# Patient Record
Sex: Male | Born: 1960 | Race: Black or African American | Hispanic: No | Marital: Single | Smoking: Never smoker
Health system: Southern US, Community
[De-identification: ages and names within clinical notes are randomized; demographics above are authoritative.]

## PROBLEM LIST (undated history)

## (undated) DIAGNOSIS — R011 Cardiac murmur, unspecified: Secondary | ICD-10-CM

## (undated) DIAGNOSIS — J349 Unspecified disorder of nose and nasal sinuses: Secondary | ICD-10-CM

## (undated) DIAGNOSIS — B029 Zoster without complications: Secondary | ICD-10-CM

## (undated) DIAGNOSIS — I10 Essential (primary) hypertension: Secondary | ICD-10-CM

## (undated) DIAGNOSIS — F419 Anxiety disorder, unspecified: Secondary | ICD-10-CM

## (undated) DIAGNOSIS — T7840XA Allergy, unspecified, initial encounter: Secondary | ICD-10-CM

## (undated) DIAGNOSIS — Z9889 Other specified postprocedural states: Secondary | ICD-10-CM

## (undated) DIAGNOSIS — M1990 Unspecified osteoarthritis, unspecified site: Secondary | ICD-10-CM

## (undated) HISTORY — PX: JOINT REPLACEMENT: SHX530

## (undated) HISTORY — DX: Allergy, unspecified, initial encounter: T78.40XA

## (undated) HISTORY — DX: Cardiac murmur, unspecified: R01.1

## (undated) HISTORY — DX: Zoster without complications: B02.9

## (undated) HISTORY — PX: OTHER SURGICAL HISTORY: SHX169

## (undated) HISTORY — DX: Other specified postprocedural states: Z98.890

---
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
## 2001-06-24 ENCOUNTER — Emergency Department (HOSPITAL_COMMUNITY): Admission: EM | Admit: 2001-06-24 | Discharge: 2001-06-24 | Payer: Self-pay | Admitting: Emergency Medicine

## 2005-05-20 ENCOUNTER — Ambulatory Visit: Payer: Self-pay | Admitting: Family Medicine

## 2007-04-10 ENCOUNTER — Ambulatory Visit (HOSPITAL_COMMUNITY): Admission: RE | Admit: 2007-04-10 | Discharge: 2007-04-10 | Payer: Self-pay | Admitting: Gastroenterology

## 2009-12-22 ENCOUNTER — Encounter: Payer: Self-pay | Admitting: Family Medicine

## 2011-01-18 NOTE — Letter (Signed)
Summary: Historic Patient File  Historic Patient File   Imported By: Lind Guest 12/22/2009 09:19:21  _____________________________________________________________________  External Attachment:    Type:   Image     Comment:   External Document

## 2012-03-14 ENCOUNTER — Other Ambulatory Visit: Payer: Self-pay | Admitting: *Deleted

## 2012-03-14 MED ORDER — LISINOPRIL 10 MG PO TABS: 10.0000 mg | ORAL_TABLET | Freq: Every day | ORAL | Status: DC

## 2012-11-12 ENCOUNTER — Ambulatory Visit (INDEPENDENT_AMBULATORY_CARE_PROVIDER_SITE_OTHER): Payer: BC Managed Care – PPO | Admitting: Family Medicine

## 2012-11-12 ENCOUNTER — Encounter: Payer: Self-pay | Admitting: Family Medicine

## 2012-11-12 VITALS — BP 158/104 | HR 90 | Temp 98.5°F | Resp 16 | Ht 69.5 in | Wt 187.0 lb

## 2012-11-12 DIAGNOSIS — M1990 Unspecified osteoarthritis, unspecified site: Secondary | ICD-10-CM

## 2012-11-12 DIAGNOSIS — Z0000 Encounter for general adult medical examination without abnormal findings: Secondary | ICD-10-CM

## 2012-11-12 DIAGNOSIS — M25569 Pain in unspecified knee: Secondary | ICD-10-CM

## 2012-11-12 DIAGNOSIS — I10 Essential (primary) hypertension: Secondary | ICD-10-CM | POA: Insufficient documentation

## 2012-11-12 DIAGNOSIS — E871 Hypo-osmolality and hyponatremia: Secondary | ICD-10-CM

## 2012-11-12 MED ORDER — LISINOPRIL 10 MG PO TABS: 10.0000 mg | ORAL_TABLET | Freq: Every day | ORAL | Status: DC

## 2012-11-12 MED ORDER — NIFEDIPINE ER OSMOTIC RELEASE 90 MG PO TB24: 90.0000 mg | ORAL_TABLET | Freq: Every day | ORAL | Status: DC

## 2012-11-12 MED ORDER — HYDROCODONE-ACETAMINOPHEN 5-325 MG PO TABS: 1.0000 | ORAL_TABLET | Freq: Four times a day (QID) | ORAL | Status: DC | PRN

## 2012-11-12 NOTE — Progress Notes (Signed)
Subjective:    Patient ID: Damon Butler, male    DOB: [birth year], 51 y.o.   MRN: [id number]  HPI Damon Butler is a 51 y.o. male  Here for CPE.  Colonoscopy in 2008 - normal, repeat in 10 years.   Last td less than 10 years ago. Declined flu vaccine.  LDL 116 in 5/12.  Plan on fish oil, diet change and recheck in 6 months.  Last psa 0.45 - 04/19/11.  Fasting today.    Hx of HTN, acne.    HTN - out of meds for months. Last ov May 2012.  Ran out of blood pressure medicine 4-6 months ago. Prior on lisinopril 10mg  QD, outside bp's on meds 120-130/80-90. Slight drunk feeeling at times, adn slight swelling in R ankle on meds.  Hyponatremia - Na 132 on 04/19/11.  Plan on follow up in 2 weeks.  Has not been rechecked.   Knee pain - both knees - seen by [facility] - 4/25.  Hx of accident in past with hardware in R knee - R worse than L knee. bad arthritis now. pain medicine, or replacement were options discussed by ortho.  Favoring l knee - more sore now in addition to Right side.  Slow work - out of work.   Review of Systems Per phs - CMA note    Objective:   Physical Exam  Constitutional: He is oriented to person, place, and time. He appears well-developed and well-nourished.  HENT:  Head: Normocephalic and atraumatic.  Right Ear: External ear normal.  Left Ear: External ear normal.  Mouth/Throat: Oropharynx is clear and moist.  Eyes: Conjunctivae normal and EOM are normal. Pupils are equal, round, and reactive to light.  Neck: Normal range of motion. Neck supple. No thyromegaly present.  Cardiovascular: Normal rate, regular rhythm, normal heart sounds and intact distal pulses.   Pulmonary/Chest: Effort normal and breath sounds normal. No respiratory distress. He has no wheezes.  Abdominal: Soft. He exhibits no distension. There is no tenderness. Hernia confirmed negative in the right inguinal area and confirmed negative in the left inguinal area.  Genitourinary:  Prostate normal.  Musculoskeletal: He exhibits no edema.       Right knee: He exhibits deformity (notable bony exostosis/oa at joint line. ). He exhibits no effusion, no ecchymosis and no erythema.  Lymphadenopathy:    He has no cervical adenopathy.  Neurological: He is alert and oriented to person, place, and time. He has normal reflexes.  Skin: Skin is warm and dry.  Psychiatric: He has a normal mood and affect. His behavior is normal.          Assessment & Plan:  Damon Butler is a 51 y.o. male 1. HTN (hypertension)  lisinopril (PRINIVIL,ZESTRIL) 10 MG tablet, NIFEdipine (PROCARDIA XL/ADALAT-CC) 90 MG 24 hr tablet, Lipid panel  2. Annual physical exam  CBC with Differential, HIV antibody, PSA, RPR, GC/chlamydia probe amp, urine, Comprehensive metabolic panel, Lipid panel  3. Hyponatremia  HIV antibody, PSA, RPR  4. Knee pain  HYDROcodone-acetaminophen (NORCO/VICODIN) 5-325 MG per tablet, CBC with Differential  5. Osteoarthritis  HYDROcodone-acetaminophen (NORCO/VICODIN) 5-325 MG per tablet, CBC with Differential, Comprehensive metabolic panel     CPE - labs as above.   HTN - uncontrolled with med nonadherence. Possible overcorrected prior and hyponatremia - will restart just the lisinopril and nifedipine.  Follow up in next few weeks. Orthostatic precautions.  R greater than L knee pain - OA,.  Deciding on possible pain mgt  eval as hesitant to have surgery at this point.  Discussed I can provide short term hydrocodone if needed, but would consider pain mgt eval.  Will discuss further at next office visit.  Hx of acne - will refill tetracycline.    Patient Instructions  Your should receive a call or letter about your lab results within the next week to 10 days.  Restart the two blood pressure medicines - lisinopril and nifedipine for now.  Keep a record of your blood pressures outside of the office and bring them to the next office visit. Recheck in the next 2-3 weeks.    Return to the clinic or go to the nearest emergency room if any of your symptoms worsen or new symptoms occur. Keeping you healthy  Get these tests  Blood pressure- Have your blood pressure checked once a year by your healthcare provider.  Normal blood pressure is 120/80  Weight- Have your body mass index (BMI) calculated to screen for obesity.  BMI is a measure of body fat based on height and weight. You can also calculate your own BMI at [email or web address].  Cholesterol- Have your cholesterol checked every year.  Diabetes- Have your blood sugar checked regularly if you have high blood pressure, high cholesterol, have a family history of diabetes or if you are overweight.  Screening for Colon Cancer- Colonoscopy starting at age 71.  Screening may begin sooner depending on your family history and other health conditions. Follow up colonoscopy as directed by your Gastroenterologist.  Screening for Prostate Cancer- Both blood work (PSA) and a rectal exam help screen for Prostate Cancer.  Screening begins at age 63 with African-American men and at age 41 with Caucasian men.  Screening may begin sooner depending on your family history.  Take these medicines  Aspirin- One aspirin daily can help prevent Heart disease and Stroke.  Flu shot- Every fall.  Tetanus- Every 10 years.  Zostavax- Once after the age of 12 to prevent Shingles.  Pneumonia shot- Once after the age of 63; if you are younger than 49, ask your healthcare provider if you need a Pneumonia shot.  Take these steps  Don't smoke- If you do smoke, talk to your doctor about quitting.  For tips on how to quit, go to www.smokefree.gov or call 1-800-QUIT-NOW.  Be physically active- Exercise 5 days a week for at least 30 minutes.  If you are not already physically active start slow and gradually work up to 30 minutes of moderate physical activity.  Examples of moderate activity include walking briskly, mowing the yard,  dancing, swimming, bicycling, etc.  Eat a healthy diet- Eat a variety of healthy food such as fruits, vegetables, low fat milk, low fat cheese, yogurt, lean meant, poultry, fish, beans, tofu, etc. For more information go to www.thenutritionsource.org  Drink alcohol in moderation- Limit alcohol intake to less than two drinks a day. Never drink and drive.  Dentist- Brush and floss twice daily; visit your dentist twice a year.  Depression- Your emotional health is as important as your physical health. If you're feeling down, or losing interest in things you would normally enjoy please talk to your healthcare provider.  Eye exam- Visit your eye doctor every year.  Safe sex- If you may be exposed to a sexually transmitted infection, use a condom.  Seat belts- Seat belts can save your life; always wear one.  Smoke/Carbon Monoxide detectors- These detectors need to be installed on the appropriate level of your home.  Replace batteries  at least once a year.  Skin cancer- When out in the sun, cover up and use sunscreen 15 SPF or higher.  Violence- If anyone is threatening you, please tell your healthcare provider.  Living Will/ Health care power of attorney- Speak with your healthcare provider and family.

## 2012-11-12 NOTE — Patient Instructions (Signed)
Your should receive a call or letter about your lab results within the next week to 10 days.  Restart the two blood pressure medicines - lisinopril and nifedipine for now.  Keep a record of your blood pressures outside of the office and bring them to the next office visit. Recheck in the next 2-3 weeks.  Return to the clinic or go to the nearest emergency room if any of your symptoms worsen or new symptoms occur. Keeping you healthy  Get these tests  Blood pressure- Have your blood pressure checked once a year by your healthcare provider.  Normal blood pressure is 120/80  Weight- Have your body mass index (BMI) calculated to screen for obesity.  BMI is a measure of body fat based on height and weight. You can also calculate your own BMI at [email or web address].  Cholesterol- Have your cholesterol checked every year.  Diabetes- Have your blood sugar checked regularly if you have high blood pressure, high cholesterol, have a family history of diabetes or if you are overweight.  Screening for Colon Cancer- Colonoscopy starting at age 71.  Screening may begin sooner depending on your family history and other health conditions. Follow up colonoscopy as directed by your Gastroenterologist.  Screening for Prostate Cancer- Both blood work (PSA) and a rectal exam help screen for Prostate Cancer.  Screening begins at age 31 with African-American men and at age 20 with Caucasian men.  Screening may begin sooner depending on your family history.  Take these medicines  Aspirin- One aspirin daily can help prevent Heart disease and Stroke.  Flu shot- Every fall.  Tetanus- Every 10 years.  Zostavax- Once after the age of 37 to prevent Shingles.  Pneumonia shot- Once after the age of 81; if you are younger than 59, ask your healthcare provider if you need a Pneumonia shot.  Take these steps  Don't smoke- If you do smoke, talk to your doctor about quitting.  For tips on how to quit, go to  www.smokefree.gov or call 1-800-QUIT-NOW.  Be physically active- Exercise 5 days a week for at least 30 minutes.  If you are not already physically active start slow and gradually work up to 30 minutes of moderate physical activity.  Examples of moderate activity include walking briskly, mowing the yard, dancing, swimming, bicycling, etc.  Eat a healthy diet- Eat a variety of healthy food such as fruits, vegetables, low fat milk, low fat cheese, yogurt, lean meant, poultry, fish, beans, tofu, etc. For more information go to www.thenutritionsource.org  Drink alcohol in moderation- Limit alcohol intake to less than two drinks a day. Never drink and drive.  Dentist- Brush and floss twice daily; visit your dentist twice a year.  Depression- Your emotional health is as important as your physical health. If you're feeling down, or losing interest in things you would normally enjoy please talk to your healthcare provider.  Eye exam- Visit your eye doctor every year.  Safe sex- If you may be exposed to a sexually transmitted infection, use a condom.  Seat belts- Seat belts can save your life; always wear one.  Smoke/Carbon Monoxide detectors- These detectors need to be installed on the appropriate level of your home.  Replace batteries at least once a year.  Skin cancer- When out in the sun, cover up and use sunscreen 15 SPF or higher.  Violence- If anyone is threatening you, please tell your healthcare provider.  Living Will/ Health care power of attorney- Speak with your healthcare provider and family.

## 2012-11-13 LAB — CBC WITH DIFFERENTIAL/PLATELET
Basophils Absolute: 0 10*3/uL (ref 0.0–0.1)
Basophils Relative: 0 % (ref 0–1)
Eosinophils Relative: 1 % (ref 0–5)
HCT: 47.4 % (ref 39.0–52.0)
MCHC: 34.8 g/dL (ref 30.0–36.0)
Monocytes Absolute: 0.4 10*3/uL (ref 0.1–1.0)
Neutro Abs: 3.9 10*3/uL (ref 1.7–7.7)
RDW: 13.6 % (ref 11.5–15.5)

## 2012-11-13 LAB — COMPREHENSIVE METABOLIC PANEL
Albumin: 4.7 g/dL (ref 3.5–5.2)
CO2: 19 mEq/L (ref 19–32)
Calcium: 9.6 mg/dL (ref 8.4–10.5)
Glucose, Bld: 114 mg/dL — ABNORMAL HIGH (ref 70–99)
Potassium: 4 mEq/L (ref 3.5–5.3)
Sodium: 139 mEq/L (ref 135–145)
Total Protein: 7.7 g/dL (ref 6.0–8.3)

## 2012-11-13 LAB — PSA: PSA: 0.47 ng/mL (ref <=4.00)

## 2012-11-13 LAB — LIPID PANEL
Cholesterol: 192 mg/dL (ref 0–200)
Triglycerides: 62 mg/dL (ref <150)

## 2012-11-26 ENCOUNTER — Ambulatory Visit (INDEPENDENT_AMBULATORY_CARE_PROVIDER_SITE_OTHER): Payer: BC Managed Care – PPO | Admitting: Family Medicine

## 2012-11-26 ENCOUNTER — Encounter: Payer: Self-pay | Admitting: Family Medicine

## 2012-11-26 VITALS — BP 120/90 | HR 108 | Temp 98.1°F | Resp 16 | Ht 69.5 in | Wt 186.6 lb

## 2012-11-26 DIAGNOSIS — I10 Essential (primary) hypertension: Secondary | ICD-10-CM

## 2012-11-26 DIAGNOSIS — E785 Hyperlipidemia, unspecified: Secondary | ICD-10-CM

## 2012-11-26 DIAGNOSIS — R7989 Other specified abnormal findings of blood chemistry: Secondary | ICD-10-CM

## 2012-11-26 DIAGNOSIS — R739 Hyperglycemia, unspecified: Secondary | ICD-10-CM

## 2012-11-26 DIAGNOSIS — M25569 Pain in unspecified knee: Secondary | ICD-10-CM

## 2012-11-26 DIAGNOSIS — M1990 Unspecified osteoarthritis, unspecified site: Secondary | ICD-10-CM

## 2012-11-26 LAB — GLUCOSE, POCT (MANUAL RESULT ENTRY): POC Glucose: 115 mg/dl — AB (ref 70–99)

## 2012-11-26 MED ORDER — HYDROCODONE-ACETAMINOPHEN 5-325 MG PO TABS: 1.0000 | ORAL_TABLET | Freq: Four times a day (QID) | ORAL | Status: DC | PRN

## 2012-11-26 NOTE — Progress Notes (Signed)
Subjective:    Patient ID: Damon Butler, male    DOB: 1961-06-10, 51 y.o.   MRN: [id number]  HPI Damon Butler is a 51 y.o. male  See last ov.  Had been off meds for months, but possibly overtreated pressure by hx.  Also noted to have hyponatremia at prior ov without follow up. uncontrolled last ov with med nonadherence. Restarted just the lisinopril and nifedipine.  Here for recheck. Lipids borderline with LDL 122, glucose elevated at 114 on last visit. Na WNL. Here for follow up today.   Feeling pretty good. Recent BP's on meds 116/84, 109/83,  No lightheadedness or dizziness on current dose of meds.    Lipids - taking fish oil.  Taking baby asa qd recently.  Has been trying to lose weight, and eating better since that office visit.   Hyperglycemia - Fasting again today, no hx of DM. Weight had been increased last ov.   R knee pain - see last ov . Knee pain - both knees - seen by [facility] - 4/25.  Hx of accident in past with hardware in R knee - R worse than L knee. bad arthritis now - bone on bone.  pain medicine, shots, or replacement were options discussed by ortho.  Favoring l knee - more sore now in addition to Right side. Taking Norco 1-2 times per day. #20 rx last ov. Ran out last week.     Results for orders placed in visit on 11/12/12  CBC WITH DIFFERENTIAL      Component Value Range   WBC 5.3  4.0 - 10.5 K/uL   RBC 5.38  4.22 - 5.81 MIL/uL   Hemoglobin 16.5  13.0 - 17.0 g/dL   HCT 95.2  84.1 - 32.4 %   MCV 88.1  78.0 - 100.0 fL   MCH 30.7  26.0 - 34.0 pg   MCHC 34.8  30.0 - 36.0 g/dL   RDW 40.1  02.7 - 25.3 %   Platelets 355  150 - 400 K/uL   Neutrophils Relative 73  43 - 77 %   Neutro Abs 3.9  1.7 - 7.7 K/uL   Lymphocytes Relative 18  12 - 46 %   Lymphs Abs 0.9  0.7 - 4.0 K/uL   Monocytes Relative 8  3 - 12 %   Monocytes Absolute 0.4  0.1 - 1.0 K/uL   Eosinophils Relative 1  0 - 5 %   Eosinophils Absolute 0.1  0.0 - 0.7 K/uL   Basophils Relative  0  0 - 1 %   Basophils Absolute 0.0  0.0 - 0.1 K/uL   Smear Review Criteria for review not met    HIV ANTIBODY (ROUTINE TESTING)      Component Value Range   HIV NON REACTIVE  NON REACTIVE  PSA      Component Value Range   PSA 0.47  <=4.00 ng/mL  RPR      Component Value Range   RPR NON REAC  NON REAC  GC/CHLAMYDIA PROBE AMP, URINE      Component Value Range   Chlamydia, Swab/Urine, PCR NEGATIVE  NEGATIVE   GC Probe Amp, Urine NEGATIVE  NEGATIVE  COMPREHENSIVE METABOLIC PANEL      Component Value Range   Sodium 139  135 - 145 mEq/L   Potassium 4.0  3.5 - 5.3 mEq/L   Chloride 101  96 - 112 mEq/L   CO2 19  19 - 32 mEq/L   Glucose, Bld  114 (*) 70 - 99 mg/dL   BUN 14  6 - 23 mg/dL   Creat 0.45  4.09 - 8.11 mg/dL   Total Bilirubin 0.7  0.3 - 1.2 mg/dL   Alkaline Phosphatase 62  39 - 117 U/L   AST 23  0 - 37 U/L   ALT 23  0 - 53 U/L   Total Protein 7.7  6.0 - 8.3 g/dL   Albumin 4.7  3.5 - 5.2 g/dL   Calcium 9.6  8.4 - 91.4 mg/dL  LIPID PANEL      Component Value Range   Cholesterol 192  0 - 200 mg/dL   Triglycerides 62  <782 mg/dL   HDL 58  >95 mg/dL   Total CHOL/HDL Ratio 3.3     VLDL 12  0 - 40 mg/dL   LDL Cholesterol 621 (*) 0 - 99 mg/dL    Review of Systems  Constitutional: Negative for fatigue and unexpected weight change.  Eyes: Negative for visual disturbance.  Respiratory: Negative for cough, chest tightness and shortness of breath.   Cardiovascular: Negative for chest pain, palpitations and leg swelling.  Gastrointestinal: Negative for abdominal pain and blood in stool.  Neurological: Negative for dizziness, light-headedness and headaches.       Objective:   Physical Exam  Constitutional: He is oriented to person, place, and time. He appears well-developed and well-nourished.  HENT:  Head: Normocephalic and atraumatic.  Eyes: EOM are normal. Pupils are equal, round, and reactive to light.  Neck: No JVD present. Carotid bruit is not present.   Cardiovascular: Normal rate, regular rhythm and normal heart sounds.   No murmur heard. Pulmonary/Chest: Effort normal and breath sounds normal. He has no rales.  Musculoskeletal: He exhibits no edema.       Right knee: He exhibits decreased range of motion (decreased extension. medial ttp, with bony prominence bilaterrally. ). tenderness found.  Neurological: He is alert and oriented to person, place, and time.  Skin: Skin is warm and dry.  Psychiatric: He has a normal mood and affect.   Results for orders placed in visit on 11/26/12  GLUCOSE, POCT (MANUAL RESULT ENTRY)      Component Value Range   POC Glucose 115 (*) 70 - 99 mg/dl  POCT GLYCOSYLATED HEMOGLOBIN (HGB A1C)      Component Value Range   Hemoglobin A1C 5.6         Assessment & Plan:  Damon Butler is a 51 y.o. male 1. HTN (hypertension)    2. Hyperglycemia  POCT glucose (manual entry), POCT glycosylated hemoglobin (Hb A1C)  3. Hyperlipidemia    4. Knee pain  HYDROcodone-acetaminophen (NORCO/VICODIN) 5-325 MG per tablet  5. Osteoarthritis  HYDROcodone-acetaminophen (NORCO/VICODIN) 5-325 MG per tablet   HTN - better control.  Continue same regimen and recheck in 3 months.   Hyperlipidemia - working on diet and exercise - recheck fasting labs in 3 months.   Hyperglycemia - prediabetes discussed.  Working on diet and exercise as above - recheck in 3 months.   R knee pain with severe OA.  lortab rx - # 60, but he will be deciding on pain mgt or ortho to follow. Asked about disability - advised he look at [facility] website and discuss with his orthopaedist.   Patient Instructions  Work on diet and exercise - recheck blood sugar and cholesterol in next 3 months.  Follow up with your orthopaedic doctor or let me know if you need a referral to a  pain management specialist for your knee.  Return to the clinic or go to the nearest emergency room if any of your symptoms worsen or new symptoms occur.

## 2012-11-26 NOTE — Patient Instructions (Addendum)
Work on diet and exercise - recheck blood sugar and cholesterol in next 3 months.  Follow up with your orthopaedic doctor or let me know if you need a referral to a pain management specialist for your knee.  Return to the clinic or go to the nearest emergency room if any of your symptoms worsen or new symptoms occur.

## 2013-02-25 ENCOUNTER — Telehealth: Payer: Self-pay | Admitting: Radiology

## 2013-02-25 ENCOUNTER — Encounter: Payer: Self-pay | Admitting: Family Medicine

## 2013-02-25 ENCOUNTER — Ambulatory Visit (INDEPENDENT_AMBULATORY_CARE_PROVIDER_SITE_OTHER): Payer: BC Managed Care – PPO | Admitting: Family Medicine

## 2013-02-25 VITALS — BP 125/81 | HR 95 | Temp 98.1°F | Resp 16 | Ht 69.5 in | Wt 186.0 lb

## 2013-02-25 DIAGNOSIS — I10 Essential (primary) hypertension: Secondary | ICD-10-CM

## 2013-02-25 DIAGNOSIS — R739 Hyperglycemia, unspecified: Secondary | ICD-10-CM

## 2013-02-25 DIAGNOSIS — E785 Hyperlipidemia, unspecified: Secondary | ICD-10-CM

## 2013-02-25 DIAGNOSIS — L709 Acne, unspecified: Secondary | ICD-10-CM

## 2013-02-25 DIAGNOSIS — Z23 Encounter for immunization: Secondary | ICD-10-CM

## 2013-02-25 DIAGNOSIS — L708 Other acne: Secondary | ICD-10-CM

## 2013-02-25 DIAGNOSIS — M25569 Pain in unspecified knee: Secondary | ICD-10-CM

## 2013-02-25 MED ORDER — LISINOPRIL 10 MG PO TABS: 10.0000 mg | ORAL_TABLET | Freq: Every day | ORAL | Status: DC

## 2013-02-25 MED ORDER — NIFEDIPINE ER OSMOTIC RELEASE 90 MG PO TB24: 90.0000 mg | ORAL_TABLET | Freq: Every day | ORAL | Status: DC

## 2013-02-25 MED ORDER — TETRACYCLINE HCL 500 MG PO CAPS: 500.0000 mg | ORAL_CAPSULE | Freq: Every day | ORAL | Status: DC | PRN

## 2013-02-25 MED ORDER — DOXYCYCLINE HYCLATE 50 MG PO CAPS: 50.0000 mg | ORAL_CAPSULE | Freq: Two times a day (BID) | ORAL | Status: DC

## 2013-02-25 NOTE — Progress Notes (Signed)
Subjective:    Patient ID: Damon Butler, male    DOB: 1961/09/11, 52 y.o.   MRN: [id number]  HPI Damon Butler is a 52 y.o. male HTN - see prior ov's - last ov 11/26/12. Had been off meds, but better control at last ov after restarting meds. Continued same regimen. Outside numbers - 120/80's.   Hyperlipidemia - working on diet and exercise -planning on recheck labs today. Weight 186 last ov and today. Trying to change diet - some fast food, especially with recent power loss with weather.  exercise limited by knee pain. LDL 122 in November 2013. Taking fish oil otc.   Hyperglycemia - prediabetes discussed last ov. Aic 5.6 on 11/26/12.  Plan on working on diet and exercise as above.  Drinks sweet tea- 2 per day. Plans to cut back on this.   R knee pain with severe OA.  lortab rx - # 60, given last ov, but he was to be deciding on pain mgt or ortho to follow. Has been evaluated by ortho. Planning on seeing other ortho for second opinion - has not scheduled yet.  Still has some pills. Only taking lortab as needed - every few weeks.    Acne - face and back - usually controlled with 1 week of tetracycline every few months. Needs refill.    Fasting today.  Last food last night.   Last td unknown - about 10 years.   Review of Systems  Constitutional: Negative for fatigue and unexpected weight change.  Eyes: Negative for visual disturbance.  Respiratory: Negative for cough, chest tightness and shortness of breath.   Cardiovascular: Negative for chest pain, palpitations and leg swelling.  Gastrointestinal: Negative for abdominal pain and blood in stool.  Skin:       Acne - face, back intermittently.   Neurological: Negative for dizziness, light-headedness and headaches.       Objective:   Physical Exam  Vitals reviewed. Constitutional: He is oriented to person, place, and time. He appears well-developed and well-nourished.  HENT:  Head: Normocephalic and atraumatic.  Eyes:  EOM are normal. Pupils are equal, round, and reactive to light.  Neck: No JVD present. Carotid bruit is not present.  Cardiovascular: Normal rate, regular rhythm and normal heart sounds.   No murmur heard. Pulmonary/Chest: Effort normal and breath sounds normal. He has no rales.  Musculoskeletal: He exhibits no edema.  Neurological: He is alert and oriented to person, place, and time.  Skin: Skin is warm and dry. Rash (few scattered comedones and scarring - back, face. ) noted.  Psychiatric: He has a normal mood and affect. His behavior is normal.          Assessment & Plan:  [name] is a 52 y.o. male HTN (hypertension) - Plan: NIFEdipine (PROCARDIA XL/ADALAT-CC) 90 MG 24 hr tablet, lisinopril (PRINIVIL,ZESTRIL) 10 MG tablet, Comprehensive metabolic panel, Lipid panel.   Well controlled at present - continue same meds.   Other and unspecified hyperlipidemia - Plan: Comprehensive metabolic panel, Lipid panel.  Continue fish oil at present.   Hyperglycemia - Plan: Comprehensive metabolic panel for repeat.  Discussed diet changes including less sodas/sweet tea - transition to unsweet or water is best.   Acne - Plan: DISCONTINUED: tetracycline (ACHROMYCIN,SUMYCIN) 500 MG capsule. Uses intermittently for flairs for 1 week.  Controlled with this regimen.    Addendum: out of tetracycline at pharmacy - substituted doxycycline.  See phone notes.   Knee pain, unspecified laterality - chronic  with OA. Planning on other ortho eval.  Rare use of hydrocodone and has some left.  Pool based exercise discussed.   Need for prophylactic vaccination with combined diphtheria-tetanus-pertussis (DTP) vaccine - Plan: Tdap vaccine greater than or equal to 7yo IM  Patient Instructions  Your should receive a call or letter about your lab results within the next week to 10 days.  Let me know if a referral is needed for the other orhtopaedic doctor, or if refill of pain med needed.    Work on  avoiding sweet tea, fast food, and portion control as discussed.  Pool based exercise can be helpful in knee arthritis for exercise.  Return to the clinic or go to the nearest emergency room if any of your symptoms worsen or new symptoms occur.     Meds ordered this encounter  Medications  . DISCONTD: tetracycline (ACHROMYCIN,SUMYCIN) 500 MG capsule    Sig: Take 500 mg by mouth daily as needed (for Acne).  . DISCONTD: tetracycline (ACHROMYCIN,SUMYCIN) 500 MG capsule    Sig: Take 1 capsule (500 mg total) by mouth daily as needed (for Acne).    Dispense:  30 capsule    Refill:  1  . NIFEdipine (PROCARDIA XL/ADALAT-CC) 90 MG 24 hr tablet    Sig: Take 1 tablet (90 mg total) by mouth daily.    Dispense:  90 tablet    Refill:  1  . lisinopril (PRINIVIL,ZESTRIL) 10 MG tablet    Sig: Take 1 tablet (10 mg total) by mouth daily.    Dispense:  90 tablet    Refill:  1

## 2013-02-25 NOTE — Patient Instructions (Signed)
Your should receive a call or letter about your lab results within the next week to 10 days.  Let me know if a referral is needed for the other orhtopaedic doctor, or if refill of pain med needed.    Work on avoiding sweet tea, fast food, and portion control as discussed.  Pool based exercise can be helpful in knee arthritis for exercise.  Return to the clinic or go to the nearest emergency room if any of your symptoms worsen or new symptoms occur.

## 2013-02-25 NOTE — Telephone Encounter (Signed)
Tetracycline discontinued, Dr Neva Seat advised Doxy 50 bid/ called pharmacy to advise.

## 2013-02-26 LAB — LIPID PANEL
Cholesterol: 180 mg/dL (ref 0–200)
HDL: 51 mg/dL
LDL Cholesterol: 118 mg/dL — ABNORMAL HIGH (ref 0–99)
Total CHOL/HDL Ratio: 3.5 ratio
Triglycerides: 57 mg/dL
VLDL: 11 mg/dL (ref 0–40)

## 2013-02-26 LAB — COMPREHENSIVE METABOLIC PANEL
AST: 17 U/L (ref 0–37)
Albumin: 4.7 g/dL (ref 3.5–5.2)
Alkaline Phosphatase: 52 U/L (ref 39–117)
BUN: 17 mg/dL (ref 6–23)
Creat: 0.97 mg/dL (ref 0.50–1.35)
Glucose, Bld: 100 mg/dL — ABNORMAL HIGH (ref 70–99)
Potassium: 4.2 mEq/L (ref 3.5–5.3)
Total Bilirubin: 0.5 mg/dL (ref 0.3–1.2)

## 2013-05-17 ENCOUNTER — Other Ambulatory Visit: Payer: Self-pay | Admitting: Family Medicine

## 2013-08-12 ENCOUNTER — Encounter: Payer: Self-pay | Admitting: Family Medicine

## 2013-08-12 ENCOUNTER — Ambulatory Visit (INDEPENDENT_AMBULATORY_CARE_PROVIDER_SITE_OTHER): Payer: BC Managed Care – PPO | Admitting: Family Medicine

## 2013-08-12 VITALS — BP 122/84 | HR 104 | Temp 99.1°F | Resp 16 | Ht 70.0 in | Wt 188.0 lb

## 2013-08-12 DIAGNOSIS — IMO0002 Reserved for concepts with insufficient information to code with codable children: Secondary | ICD-10-CM

## 2013-08-12 DIAGNOSIS — L708 Other acne: Secondary | ICD-10-CM

## 2013-08-12 DIAGNOSIS — I10 Essential (primary) hypertension: Secondary | ICD-10-CM

## 2013-08-12 DIAGNOSIS — M1710 Unilateral primary osteoarthritis, unspecified knee: Secondary | ICD-10-CM

## 2013-08-12 DIAGNOSIS — M1711 Unilateral primary osteoarthritis, right knee: Secondary | ICD-10-CM

## 2013-08-12 DIAGNOSIS — L709 Acne, unspecified: Secondary | ICD-10-CM

## 2013-08-12 DIAGNOSIS — E785 Hyperlipidemia, unspecified: Secondary | ICD-10-CM

## 2013-08-12 LAB — LIPID PANEL
Cholesterol: 173 mg/dL (ref 0–200)
Total CHOL/HDL Ratio: 3.3 Ratio
Triglycerides: 100 mg/dL (ref <150)
VLDL: 20 mg/dL (ref 0–40)

## 2013-08-12 LAB — COMPREHENSIVE METABOLIC PANEL
BUN: 13 mg/dL (ref 6–23)
CO2: 24 mEq/L (ref 19–32)
Calcium: 9.6 mg/dL (ref 8.4–10.5)
Chloride: 99 mEq/L (ref 96–112)
Creat: 0.94 mg/dL (ref 0.50–1.35)
Glucose, Bld: 99 mg/dL (ref 70–99)
Total Bilirubin: 0.5 mg/dL (ref 0.3–1.2)

## 2013-08-12 MED ORDER — NIFEDIPINE ER OSMOTIC RELEASE 90 MG PO TB24: 90.0000 mg | ORAL_TABLET | Freq: Every day | ORAL | Status: DC

## 2013-08-12 MED ORDER — LISINOPRIL 10 MG PO TABS: 10.0000 mg | ORAL_TABLET | Freq: Every day | ORAL | Status: DC

## 2013-08-12 NOTE — Progress Notes (Signed)
Subjective:    Patient ID: Damon Butler, male    DOB: 1961/10/01, 52 y.o.   MRN: [id number]  HPI Damon Butler is a 52 y.o. male  Here for follow up. Last ov 02/25/13. Fasting today.   HTN - outside BP's - 120's/80-90.  Rare cramps - toes.  Loopy about a month ago  - not dizzy, not lightheaded, no chest pain. Just felt different for few hours, then back to normal.   Hyperlipidemia - working on diet and exercise prior, labs as below. Weight 186 last 2 ov's.   Exercise limited by knee pain. Taking fish oil otc. Walking. Weight 188. Has mead some diet changes. More fruit, but still eats late.   Hyperglycemia - prediabetes discussed prior, with HGB Aic 5.6 on 11/26/12.  Planned on working on diet and exercise.    R knee pain with severe OA.  lortab rx - # 60, given, and as of last ov -only taking lortab as needed - every few weeks. Has been seen by ortho and was getting second opinion. Would like to see Dr. Lequita Halt to discuss.   Acne - face and back - usually controlled with 1 week of tetracycline every few month in past.  Had to change to doxycycline 50mg  bid last ov due to backorder of tetracycline.  Only taking if needed- none recently. No refills needed on this.    Results for orders placed in visit on 02/25/13  COMPREHENSIVE METABOLIC PANEL      Result Value Range   Sodium 135  135 - 145 mEq/L   Potassium 4.2  3.5 - 5.3 mEq/L   Chloride 102  96 - 112 mEq/L   CO2 24  19 - 32 mEq/L   Glucose, Bld 100 (*) 70 - 99 mg/dL   BUN 17  6 - 23 mg/dL   Creat 4.09  8.11 - 9.14 mg/dL   Total Bilirubin 0.5  0.3 - 1.2 mg/dL   Alkaline Phosphatase 52  39 - 117 U/L   AST 17  0 - 37 U/L   ALT 17  0 - 53 U/L   Total Protein 7.2  6.0 - 8.3 g/dL   Albumin 4.7  3.5 - 5.2 g/dL   Calcium 9.4  8.4 - 78.2 mg/dL  LIPID PANEL      Result Value Range   Cholesterol 180  0 - 200 mg/dL   Triglycerides 57  <956 mg/dL   HDL 51  >21 mg/dL   Total CHOL/HDL Ratio 3.5     VLDL 11  0 - 40 mg/dL   LDL Cholesterol 308 (*) 0 - 99 mg/dL   Patient Active Problem List   Diagnosis Date Noted  . Other and unspecified hyperlipidemia 08/12/2013  . Acne 08/12/2013  . HTN (hypertension) 11/12/2012    Past Medical History  Diagnosis Date  . Allergy   . Heart murmur    No past surgical history on file.  No Known Allergies Prior to Admission medications   Medication Sig Start Date End Date Taking? Authorizing Provider  aspirin 81 MG tablet Take 81 mg by mouth daily.    Historical Provider, MD  doxycycline (VIBRAMYCIN) 50 MG capsule Take 1 capsule (50 mg total) by mouth 2 (two) times daily. 02/25/13   Shade Flood, MD  fish oil-omega-3 fatty acids 1000 MG capsule Take 2 g by mouth daily.    Historical Provider, MD  HYDROcodone-acetaminophen (NORCO/VICODIN) 5-325 MG per tablet Take 1 tablet by mouth every 6 (  six) hours as needed for pain. 11/26/12   Shade Flood, MD  lisinopril (PRINIVIL,ZESTRIL) 10 MG tablet Take 1 tablet (10 mg total) by mouth daily. 02/25/13 02/25/14  Shade Flood, MD  NIFEdipine (PROCARDIA XL/ADALAT-CC) 90 MG 24 hr tablet Take 1 tablet (90 mg total) by mouth daily. 02/25/13   Shade Flood, MD       Review of Systems  Constitutional: Negative for fatigue and unexpected weight change.  Eyes: Negative for visual disturbance.  Respiratory: Negative for cough, chest tightness and shortness of breath.   Cardiovascular: Negative for chest pain, palpitations and leg swelling.  Gastrointestinal: Negative for abdominal pain and blood in stool.  Musculoskeletal: Positive for myalgias (rare cramping in toes. ).  Neurological: Negative for dizziness (rare "loopiness" about a month ago, not now. no faocal weakness. ), light-headedness and headaches.       Objective:   Physical Exam  Vitals reviewed. Constitutional: He is oriented to person, place, and time. He appears well-developed and well-nourished.  HENT:  Head: Normocephalic and atraumatic.  Eyes: EOM are  normal. Pupils are equal, round, and reactive to light.  Neck: No JVD present. Carotid bruit is not present.  Cardiovascular: Normal rate, regular rhythm and normal heart sounds.   No murmur heard. Pulmonary/Chest: Effort normal and breath sounds normal. He has no rales.  Musculoskeletal: He exhibits no edema.  Neurological: He is alert and oriented to person, place, and time. He displays no atrophy and no tremor. No cranial nerve deficit or sensory deficit. He exhibits normal muscle tone. He displays a negative Romberg sign. He displays no seizure activity. Coordination and gait normal.  Nonfocal, no pronator drift, no weakness appreciated.   Skin: Skin is warm and dry.  Psychiatric: He has a normal mood and affect. His behavior is normal.        Assessment & Plan:   Damon Butler is a 52 y.o. male Other and unspecified hyperlipidemia - Plan: Comprehensive metabolic panel, Lipid panel pending. Cont fish oil, diet, exercise.   Acne - stable. Has doxycycline if needed for flair.   HTN (hypertension) - Plan: NIFEdipine (PROCARDIA XL/ADALAT-CC) 90 MG 24 hr tablet, lisinopril (PRINIVIL,ZESTRIL) 10 MG tablet refilled.  Stable at present.   Right knee DJD - Plan: Ambulatory referral to Orthopedic Surgery - referred to Dr. Lequita Halt for consideration of TKR. Cont vicodin if needed prn. Water based exercise may be easier.   Episode of "loopiness", now resolved.  No focal neuro findings, no residual sx's, but if recurs - go to ER or be seen right away.  Does work in the heat at times - drink plenty of fluids. Continue  ASA 81mg  qd, and er/911 precautions discussed.   Meds ordered this encounter  Medications  . NIFEdipine (PROCARDIA XL/ADALAT-CC) 90 MG 24 hr tablet    Sig: Take 1 tablet (90 mg total) by mouth daily.    Dispense:  90 tablet    Refill:  1  . lisinopril (PRINIVIL,ZESTRIL) 10 MG tablet    Sig: Take 1 tablet (10 mg total) by mouth daily.    Dispense:  90 tablet    Refill:   1   Patient Instructions  Return to the clinic or go to the nearest emergency room if any of your symptoms worsen or new symptoms occur. You should receive a call or letter about your lab results within the next week to 10 days.  We will refer you to Dr. Lequita Halt for your knee.  If  any further "loopy" symptoms - be seen right away - emergency room or call 911 if necessary.  Recheck in 6 months.

## 2013-08-12 NOTE — Patient Instructions (Signed)
Return to the clinic or go to the nearest emergency room if any of your symptoms worsen or new symptoms occur. You should receive a call or letter about your lab results within the next week to 10 days.  We will refer you to Dr. Lequita Halt for your knee.  If any further "loopy" symptoms - be seen right away - emergency room or call 911 if necessary.  Recheck in 6 months.

## 2013-08-18 ENCOUNTER — Other Ambulatory Visit: Payer: Self-pay | Admitting: Family Medicine

## 2013-08-18 DIAGNOSIS — E871 Hypo-osmolality and hyponatremia: Secondary | ICD-10-CM

## 2013-08-26 ENCOUNTER — Ambulatory Visit: Payer: BC Managed Care – PPO | Admitting: Family Medicine

## 2013-09-09 ENCOUNTER — Other Ambulatory Visit (INDEPENDENT_AMBULATORY_CARE_PROVIDER_SITE_OTHER): Payer: BC Managed Care – PPO | Admitting: *Deleted

## 2013-09-09 DIAGNOSIS — E871 Hypo-osmolality and hyponatremia: Secondary | ICD-10-CM

## 2013-09-09 NOTE — Progress Notes (Signed)
Patient here for labs only. 

## 2013-09-10 LAB — BASIC METABOLIC PANEL
BUN: 13 mg/dL (ref 6–23)
Creat: 0.98 mg/dL (ref 0.50–1.35)
Glucose, Bld: 96 mg/dL (ref 70–99)

## 2013-09-11 ENCOUNTER — Encounter: Payer: Self-pay | Admitting: Family Medicine

## 2014-02-10 ENCOUNTER — Encounter: Payer: Self-pay | Admitting: Family Medicine

## 2014-02-10 ENCOUNTER — Ambulatory Visit (INDEPENDENT_AMBULATORY_CARE_PROVIDER_SITE_OTHER): Payer: BC Managed Care – PPO | Admitting: Family Medicine

## 2014-02-10 VITALS — BP 136/94 | HR 109 | Temp 98.2°F | Resp 16 | Ht 69.5 in | Wt 184.0 lb

## 2014-02-10 DIAGNOSIS — Z01818 Encounter for other preprocedural examination: Secondary | ICD-10-CM

## 2014-02-10 DIAGNOSIS — E871 Hypo-osmolality and hyponatremia: Secondary | ICD-10-CM

## 2014-02-10 DIAGNOSIS — I10 Essential (primary) hypertension: Secondary | ICD-10-CM

## 2014-02-10 DIAGNOSIS — M25569 Pain in unspecified knee: Secondary | ICD-10-CM

## 2014-02-10 DIAGNOSIS — L708 Other acne: Secondary | ICD-10-CM

## 2014-02-10 DIAGNOSIS — L709 Acne, unspecified: Secondary | ICD-10-CM

## 2014-02-10 DIAGNOSIS — M25561 Pain in right knee: Secondary | ICD-10-CM

## 2014-02-10 LAB — COMPLETE METABOLIC PANEL WITH GFR
ALT: 19 U/L (ref 0–53)
AST: 19 U/L (ref 0–37)
Albumin: 4.5 g/dL (ref 3.5–5.2)
Alkaline Phosphatase: 55 U/L (ref 39–117)
BILIRUBIN TOTAL: 0.6 mg/dL (ref 0.2–1.2)
BUN: 11 mg/dL (ref 6–23)
CALCIUM: 9.5 mg/dL (ref 8.4–10.5)
CHLORIDE: 98 meq/L (ref 96–112)
CO2: 26 meq/L (ref 19–32)
Creat: 0.97 mg/dL (ref 0.50–1.35)
GFR, Est African American: >89 mL/min
GLUCOSE: 110 mg/dL — AB (ref 70–99)
Potassium: 4.2 mEq/L (ref 3.5–5.3)
Sodium: 133 mEq/L — ABNORMAL LOW (ref 135–145)
Total Protein: 7.3 g/dL (ref 6.0–8.3)

## 2014-02-10 LAB — LIPID PANEL
Cholesterol: 193 mg/dL (ref 0–200)
HDL: 58 mg/dL (ref >39)
LDL Cholesterol: 117 mg/dL — ABNORMAL HIGH (ref 0–99)
TRIGLYCERIDES: 92 mg/dL (ref <150)
Total CHOL/HDL Ratio: 3.3 Ratio
VLDL: 18 mg/dL (ref 0–40)

## 2014-02-10 MED ORDER — NIFEDIPINE ER OSMOTIC RELEASE 90 MG PO TB24: 90.0000 mg | ORAL_TABLET | Freq: Every day | ORAL | Status: DC

## 2014-02-10 MED ORDER — LISINOPRIL 10 MG PO TABS: 10.0000 mg | ORAL_TABLET | Freq: Every day | ORAL | Status: DC

## 2014-02-10 NOTE — Patient Instructions (Signed)
You should receive a call or letter about your lab results within the next week to 10 days.  Tell your surgeon's office to send me letter if clearance needed - I may be able to call you to discuss this or possible office visit.

## 2014-02-10 NOTE — Progress Notes (Signed)
Subjective:    Patient ID: Damon Butler, male    DOB: Feb 09, 1961, 53 y.o.   MRN: [id number]  HPI Damon Butler is a 53 y.o. male Here for follow up last seen 07/2013. Needs med refilled.   fasting labs since last ov:   Chemistry      Component Value Date/Time   NA 134* 09/09/2013 1610   K 4.1 09/09/2013 1610   CL 102 09/09/2013 1610   CO2 25 09/09/2013 1610   BUN 13 09/09/2013 1610   CREATININE 0.98 09/09/2013 1610      Component Value Date/Time   CALCIUM 9.6 09/09/2013 1610   ALKPHOS 51 08/12/2013 1503   AST 19 08/12/2013 1503   ALT 23 08/12/2013 1503   BILITOT 0.5 08/12/2013 1503     Lipid Panel     Component Value Date/Time   CHOL 173 08/12/2013 1503   TRIG 100 08/12/2013 1503   HDL 53 08/12/2013 1503   CHOLHDL 3.3 08/12/2013 1503   VLDL 20 08/12/2013 1503   LDLCALC 100* 08/12/2013 1503    HTN - outside BP's - 120/80's.  Rushing today.  Rare cramps - toes, R leg at times.  No further lightheadedness. No chest pains, no other new side effects. Taking lisinopril and nifedipine QD.   Hyperlipidemia - working on diet and exercise, exercise limited by knee pain. Taking fish oil otc. Asa 81mg  qd.  Walking. Weight 188 to 184 since last ov.   Hyperglycemia - prediabetes discussed prior, with HGB Aic 5.6 on 11/26/12.  Most recent fasting blood sugars normal.   R knee pain with severe OA. Planning on TKR on April 6th - Dr. Lequita Butler. May need to have clearance before surgery. No dyspnea with 2 city blocks, or flight of stairs. No reactions with anesthesia in past.   Hyponatremia - borderline - 134 sept last year - recheck today.   Acne - face and back - has doxycycline if needed. Not having to use regularly.  Will call if refills needed.   Review of Systems  Constitutional: Negative for fatigue and unexpected weight change.  HENT: Positive for congestion (occasional. ).   Eyes: Negative for visual disturbance.  Respiratory: Negative for cough, chest tightness and shortness  of breath.   Cardiovascular: Negative for chest pain, palpitations and leg swelling.  Gastrointestinal: Negative for abdominal pain and blood in stool.  Skin: Negative for rash.       Few comedones on face, overall controlled.   Neurological: Negative for dizziness, light-headedness and headaches.       Objective:   Physical Exam  Vitals reviewed. Constitutional: He is oriented to person, place, and time. He appears well-developed and well-nourished.  HENT:  Head: Normocephalic and atraumatic.  Eyes: EOM are normal. Pupils are equal, round, and reactive to light.  Neck: No JVD present. Carotid bruit is not present.  Cardiovascular: Normal rate, regular rhythm and normal heart sounds.   No murmur heard. Pulmonary/Chest: Effort normal and breath sounds normal. He has no rales.  Musculoskeletal: He exhibits no edema.  Neurological: He is alert and oriented to person, place, and time.  Skin: Skin is warm and dry.  Few noninflamed comedones on upper forehead.   Psychiatric: He has a normal mood and affect.   Filed Vitals:   02/10/14 1418  BP: 136/94  Pulse: 109  Temp: 98.2 F (36.8 C)  TempSrc: Oral  Resp: 16  Height: 5' 9.5" (1.765 m)  Weight: 184 lb (83.462 kg)  SpO2:  99%   EKG: SR,  no acute findings.     Assessment & Plan:   Damon Butler is a 53 y.o. male HTN (hypertension) - Plan: COMPLETE METABOLIC PANEL WITH GFR, lisinopril (PRINIVIL,ZESTRIL) 10 MG tablet, NIFEdipine (PROCARDIA XL/ADALAT-CC) 90 MG 24 hr tablet, Lipid panel, EKG 12-Lead - reassuring home BP's - appears to be controlled overall. Cont same doses, and ambulatory BP monitoring/home BP's.   Hyponatremia - Plan: COMPLETE METABOLIC PANEL WITH GFR - in past, normalized at last check. Repeat cmp.   Acne - stable. Has doxycycline if needed.  Call if refills needed.   Right knee pain with severe OA and planning on TKR. May need paperwork completed from surgery office.  Preoperative evaluation to rule  out surgical contraindication - Plan: EKG 12-Lead obtained - no acute findings and no concerning symptoms on initial screening history today. May be able to discuss clearance over phone once request received.    Meds ordered this encounter  Medications  . lisinopril (PRINIVIL,ZESTRIL) 10 MG tablet    Sig: Take 1 tablet (10 mg total) by mouth daily.    Dispense:  90 tablet    Refill:  1  . NIFEdipine (PROCARDIA XL/ADALAT-CC) 90 MG 24 hr tablet    Sig: Take 1 tablet (90 mg total) by mouth daily.    Dispense:  90 tablet    Refill:  1   Patient Instructions  You should receive a call or letter about your lab results within the next week to 10 days.  Tell your surgeon's office to send me letter if clearance needed - I may be able to call you to discuss this or possible office visit.

## 2014-02-24 ENCOUNTER — Telehealth: Payer: Self-pay | Admitting: Radiology

## 2014-02-24 NOTE — Telephone Encounter (Signed)
I have gotten a letter for clearance for patients knee surgery. Please advise, can you clear him?

## 2014-02-24 NOTE — Telephone Encounter (Signed)
Anticipated this at most recent ov, but may need to discuss further over phone. Can place request in my box. Thanks. -JG

## 2014-02-26 NOTE — Telephone Encounter (Signed)
In your box

## 2014-02-27 ENCOUNTER — Other Ambulatory Visit: Payer: Self-pay | Admitting: Orthopedic Surgery

## 2014-02-28 ENCOUNTER — Other Ambulatory Visit: Payer: Self-pay | Admitting: Orthopedic Surgery
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
## 2014-03-11 ENCOUNTER — Encounter (HOSPITAL_COMMUNITY): Payer: Self-pay | Admitting: Pharmacy Technician

## 2014-03-14 ENCOUNTER — Encounter (HOSPITAL_COMMUNITY)
Admission: RE | Admit: 2014-03-14 | Discharge: 2014-03-14 | Disposition: A | Discharge status: Home / Self Care | Payer: BC Managed Care – PPO | Source: Ambulatory Visit | Attending: Orthopedic Surgery | Admitting: Orthopedic Surgery

## 2014-03-14 ENCOUNTER — Ambulatory Visit (HOSPITAL_COMMUNITY)
Admission: RE | Admit: 2014-03-14 | Discharge: 2014-03-14 | Disposition: A | Discharge status: Home / Self Care | Payer: BC Managed Care – PPO | Source: Ambulatory Visit | Attending: Orthopedic Surgery | Admitting: Orthopedic Surgery

## 2014-03-14 ENCOUNTER — Encounter (INDEPENDENT_AMBULATORY_CARE_PROVIDER_SITE_OTHER): Payer: Self-pay

## 2014-03-14 ENCOUNTER — Encounter (HOSPITAL_COMMUNITY): Payer: Self-pay

## 2014-03-14 DIAGNOSIS — Z8739 Personal history of other diseases of the musculoskeletal system and connective tissue: Secondary | ICD-10-CM | POA: Insufficient documentation

## 2014-03-14 DIAGNOSIS — Z01818 Encounter for other preprocedural examination: Secondary | ICD-10-CM | POA: Insufficient documentation

## 2014-03-14 HISTORY — DX: Anxiety disorder, unspecified: F41.9

## 2014-03-14 HISTORY — DX: Unspecified disorder of nose and nasal sinuses: J34.9

## 2014-03-14 HISTORY — DX: Essential (primary) hypertension: I10

## 2014-03-14 HISTORY — DX: Unspecified osteoarthritis, unspecified site: M19.90

## 2014-03-14 LAB — COMPREHENSIVE METABOLIC PANEL
ALT: 25 U/L (ref 0–53)
AST: 21 U/L (ref 0–37)
Albumin: 4.1 g/dL (ref 3.5–5.2)
Alkaline Phosphatase: 65 U/L (ref 39–117)
BUN: 12 mg/dL (ref 6–23)
CO2: 26 mEq/L (ref 19–32)
Calcium: 9.6 mg/dL (ref 8.4–10.5)
Chloride: 98 mEq/L (ref 96–112)
Creatinine, Ser: 0.88 mg/dL (ref 0.50–1.35)
GFR calc Af Amer: >90 mL/min (ref >90)
GFR calc non Af Amer: >90 mL/min (ref >90)
Glucose, Bld: 101 mg/dL — ABNORMAL HIGH (ref 70–99)
Potassium: 4.4 mEq/L (ref 3.7–5.3)
Sodium: 135 mEq/L — ABNORMAL LOW (ref 137–147)
Total Bilirubin: 0.4 mg/dL (ref 0.3–1.2)
Total Protein: 7.9 g/dL (ref 6.0–8.3)

## 2014-03-14 LAB — URINALYSIS, ROUTINE W REFLEX MICROSCOPIC
Bilirubin Urine: NEGATIVE
Glucose, UA: NEGATIVE mg/dL
Hgb urine dipstick: NEGATIVE
Ketones, ur: NEGATIVE mg/dL
LEUKOCYTES UA: NEGATIVE
Nitrite: NEGATIVE
PROTEIN: NEGATIVE mg/dL
Specific Gravity, Urine: 1.017 (ref 1.005–1.030)
Urobilinogen, UA: 0.2 mg/dL (ref 0.0–1.0)
pH: 7.5 (ref 5.0–8.0)

## 2014-03-14 LAB — PROTIME-INR
INR: 0.95 (ref 0.00–1.49)
PROTHROMBIN TIME: 12.5 s (ref 11.6–15.2)

## 2014-03-14 LAB — CBC
HCT: 43.1 % (ref 39.0–52.0)
Hemoglobin: 14.5 g/dL (ref 13.0–17.0)
MCH: 30.5 pg (ref 26.0–34.0)
MCHC: 33.6 g/dL (ref 30.0–36.0)
MCV: 90.7 fL (ref 78.0–100.0)
Platelets: 361 10*3/uL (ref 150–400)
RBC: 4.75 MIL/uL (ref 4.22–5.81)
RDW: 12.8 % (ref 11.5–15.5)
WBC: 5.8 10*3/uL (ref 4.0–10.5)

## 2014-03-14 LAB — SURGICAL PCR SCREEN
MRSA, PCR: NEGATIVE
STAPHYLOCOCCUS AUREUS: NEGATIVE

## 2014-03-14 LAB — APTT: aPTT: 30 seconds (ref 24–37)

## 2014-03-14 NOTE — Pre-Procedure Instructions (Addendum)
EKG REPORT 02/11/14 IN EPIC. [facility].

## 2014-03-14 NOTE — Patient Instructions (Signed)
   [facility]  ON:  Monday  4/6  REPORT TO  SHORT STAY CENTER AT:  6:30 AM      PHONE # FOR SHORT STAY IS [phone]  DO NOT EAT OR DRINK ANYTHING AFTER MIDNIGHT THE NIGHT BEFORE YOUR SURGERY.  YOU MAY BRUSH YOUR TEETH, RINSE OUT YOUR MOUTH--BUT NO WATER, NO FOOD, NO CHEWING GUM, NO MINTS, NO CANDIES, NO CHEWING TOBACCO.  PLEASE TAKE THE FOLLOWING MEDICATIONS THE AM OF YOUR SURGERY WITH A FEW SIPS OF WATER:  NIFEDIPINE   DO NOT BRING VALUABLES, MONEY, CREDIT CARDS.  DO NOT WEAR JEWELRY, MAKE-UP, NAIL POLISH AND NO METAL PINS OR CLIPS IN YOUR HAIR. CONTACT LENS, DENTURES / PARTIALS, GLASSES SHOULD NOT BE WORN TO SURGERY AND IN MOST CASES-HEARING AIDS WILL NEED TO BE REMOVED.  BRING YOUR GLASSES CASE, ANY EQUIPMENT NEEDED FOR YOUR CONTACT LENS. FOR PATIENTS ADMITTED TO THE HOSPITAL--CHECK OUT TIME THE DAY OF DISCHARGE IS 11:00 AM.  ALL INPATIENT ROOMS ARE PRIVATE - WITH BATHROOM, TELEPHONE, TELEVISION AND WIFI INTERNET.                                                    PLEASE READ OVER ANY  FACT SHEETS THAT YOU WERE GIVEN: MRSA INFORMATION, BLOOD TRANSFUSION INFORMATION, INCENTIVE SPIROMETER INFORMATION.  FAILURE TO FOLLOW THESE INSTRUCTIONS MAY RESULT IN THE CANCELLATION OF YOUR SURGERY. PLEASE BE AWARE THAT YOU MAY NEED ADDITIONAL BLOOD DRAWN DAY OF YOUR SURGERY  PATIENT SIGNATURE_________________________________

## 2014-03-17 NOTE — Pre-Procedure Instructions (Signed)
DR. Deri FuellingALUISIO'S OFFICE FAXED PT'S MEDICAL CLEARANCE / OFFICE NOTES 02/10/14 FROM DR. JEFFREY GREENE - PLACED ON PT'S CHART.

## 2014-03-20 ENCOUNTER — Other Ambulatory Visit: Payer: Self-pay | Admitting: Orthopedic Surgery
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
## 2014-03-24 ENCOUNTER — Encounter (HOSPITAL_COMMUNITY)
Admission: RE | Disposition: A | Discharge status: Home Health | Payer: Self-pay | Source: Ambulatory Visit | Attending: Orthopedic Surgery

## 2014-03-24 ENCOUNTER — Encounter (HOSPITAL_COMMUNITY): Payer: BC Managed Care – PPO | Admitting: Certified Registered Nurse Anesthetist

## 2014-03-24 ENCOUNTER — Encounter (HOSPITAL_COMMUNITY): Payer: Self-pay | Admitting: *Deleted

## 2014-03-24 ENCOUNTER — Inpatient Hospital Stay (HOSPITAL_COMMUNITY)
Admission: RE | Admit: 2014-03-24 | Discharge: 2014-03-26 | DRG: 470 | Disposition: A | Discharge status: Home Health | Payer: BC Managed Care – PPO | Source: Ambulatory Visit | Attending: Orthopedic Surgery | Admitting: Orthopedic Surgery

## 2014-03-24 ENCOUNTER — Inpatient Hospital Stay (HOSPITAL_COMMUNITY): Payer: BC Managed Care – PPO | Admitting: Certified Registered Nurse Anesthetist

## 2014-03-24 DIAGNOSIS — M179 Osteoarthritis of knee, unspecified: Secondary | ICD-10-CM | POA: Diagnosis present

## 2014-03-24 DIAGNOSIS — M948X9 Other specified disorders of cartilage, unspecified sites: Secondary | ICD-10-CM | POA: Diagnosis present

## 2014-03-24 DIAGNOSIS — Z96659 Presence of unspecified artificial knee joint: Secondary | ICD-10-CM

## 2014-03-24 DIAGNOSIS — Z7982 Long term (current) use of aspirin: Secondary | ICD-10-CM

## 2014-03-24 DIAGNOSIS — I10 Essential (primary) hypertension: Secondary | ICD-10-CM | POA: Diagnosis present

## 2014-03-24 DIAGNOSIS — M1710 Unilateral primary osteoarthritis, unspecified knee: Principal | ICD-10-CM | POA: Diagnosis present

## 2014-03-24 DIAGNOSIS — Z6826 Body mass index (BMI) 26.0-26.9, adult: Secondary | ICD-10-CM

## 2014-03-24 DIAGNOSIS — E7800 Pure hypercholesterolemia, unspecified: Secondary | ICD-10-CM | POA: Diagnosis present

## 2014-03-24 DIAGNOSIS — F411 Generalized anxiety disorder: Secondary | ICD-10-CM | POA: Diagnosis present

## 2014-03-24 HISTORY — PX: TOTAL KNEE ARTHROPLASTY: SHX125

## 2014-03-24 LAB — TYPE AND SCREEN
ABO/RH(D): O POS
Antibody Screen: NEGATIVE

## 2014-03-24 LAB — ABO/RH: ABO/RH(D): O POS

## 2014-03-24 SURGERY — ARTHROPLASTY, KNEE, TOTAL
Anesthesia: Spinal | Site: Knee | Laterality: Right

## 2014-03-24 MED ORDER — METOCLOPRAMIDE HCL 10 MG PO TABS: 5.0000 mg | ORAL_TABLET | Freq: Three times a day (TID) | ORAL | Status: DC | PRN

## 2014-03-24 MED ORDER — HYDROMORPHONE HCL PF 1 MG/ML IJ SOLN
INTRAMUSCULAR | Status: AC
  Filled 2014-03-24: qty 1

## 2014-03-24 MED ORDER — PROPOFOL 10 MG/ML IV BOLUS
INTRAVENOUS | Status: AC
  Filled 2014-03-24: qty 20

## 2014-03-24 MED ORDER — CHLORHEXIDINE GLUCONATE 4 % EX LIQD
60.0000 mL | Freq: Once | CUTANEOUS | Status: DC
Start: 2014-03-24 — End: 2014-03-24

## 2014-03-24 MED ORDER — ACETAMINOPHEN 650 MG RE SUPP: 650.0000 mg | Freq: Four times a day (QID) | RECTAL | Status: DC | PRN

## 2014-03-24 MED ORDER — ONDANSETRON HCL 4 MG/2ML IJ SOLN
INTRAMUSCULAR | Status: AC
  Filled 2014-03-24: qty 2

## 2014-03-24 MED ORDER — LACTATED RINGERS IV SOLN
INTRAVENOUS | Status: DC | PRN
  Administered 2014-03-24 (×3): via INTRAVENOUS

## 2014-03-24 MED ORDER — DOCUSATE SODIUM 100 MG PO CAPS
100.0000 mg | ORAL_CAPSULE | Freq: Two times a day (BID) | ORAL | Status: DC
  Administered 2014-03-24 – 2014-03-26 (×4): 100 mg via ORAL

## 2014-03-24 MED ORDER — MIDAZOLAM HCL 5 MG/5ML IJ SOLN
INTRAMUSCULAR | Status: DC | PRN
  Administered 2014-03-24: 2 mg via INTRAVENOUS

## 2014-03-24 MED ORDER — KETOROLAC TROMETHAMINE 15 MG/ML IJ SOLN: 7.5000 mg | Freq: Four times a day (QID) | INTRAMUSCULAR | Status: AC | PRN

## 2014-03-24 MED ORDER — DEXAMETHASONE SODIUM PHOSPHATE 10 MG/ML IJ SOLN
10.0000 mg | Freq: Once | INTRAMUSCULAR | Status: AC
  Administered 2014-03-24: 10 mg via INTRAVENOUS

## 2014-03-24 MED ORDER — NIFEDIPINE ER OSMOTIC RELEASE 90 MG PO TB24
90.0000 mg | ORAL_TABLET | Freq: Every day | ORAL | Status: DC
  Administered 2014-03-25 – 2014-03-26 (×2): 90 mg via ORAL
  Filled 2014-03-24 (×2): qty 1

## 2014-03-24 MED ORDER — PROMETHAZINE HCL 25 MG/ML IJ SOLN: 6.2500 mg | INTRAMUSCULAR | Status: DC | PRN

## 2014-03-24 MED ORDER — OXYCODONE HCL 5 MG PO TABS
5.0000 mg | ORAL_TABLET | ORAL | Status: DC | PRN
  Administered 2014-03-24 – 2014-03-26 (×11): 10 mg via ORAL
  Filled 2014-03-24 (×11): qty 2

## 2014-03-24 MED ORDER — BUPIVACAINE HCL (PF) 0.75 % IJ SOLN
INTRAMUSCULAR | Status: DC | PRN
  Administered 2014-03-24: 1.6 mL via INTRATHECAL

## 2014-03-24 MED ORDER — LIDOCAINE HCL (CARDIAC) 20 MG/ML IV SOLN
INTRAVENOUS | Status: AC
  Filled 2014-03-24: qty 5

## 2014-03-24 MED ORDER — ACETAMINOPHEN 10 MG/ML IV SOLN
1000.0000 mg | Freq: Once | INTRAVENOUS | Status: AC
  Administered 2014-03-24: 1000 mg via INTRAVENOUS
  Filled 2014-03-24: qty 100

## 2014-03-24 MED ORDER — CEFAZOLIN SODIUM-DEXTROSE 2-3 GM-% IV SOLR
2.0000 g | Freq: Four times a day (QID) | INTRAVENOUS | Status: AC
  Administered 2014-03-24 (×2): 2 g via INTRAVENOUS
  Filled 2014-03-24 (×2): qty 50

## 2014-03-24 MED ORDER — PHENOL 1.4 % MT LIQD: 1.0000 | OROMUCOSAL | Status: DC | PRN

## 2014-03-24 MED ORDER — BUPIVACAINE HCL (PF) 0.25 % IJ SOLN
INTRAMUSCULAR | Status: AC
Start: 2014-03-24 — End: 2014-03-24
  Filled 2014-03-24: qty 30

## 2014-03-24 MED ORDER — LIDOCAINE HCL (CARDIAC) 20 MG/ML IV SOLN
INTRAVENOUS | Status: DC | PRN
  Administered 2014-03-24: 100 mg via INTRAVENOUS

## 2014-03-24 MED ORDER — BUPIVACAINE LIPOSOME 1.3 % IJ SUSP
20.0000 mL | Freq: Once | INTRAMUSCULAR | Status: DC
  Filled 2014-03-24: qty 20

## 2014-03-24 MED ORDER — SODIUM CHLORIDE 0.9 % IJ SOLN
INTRAMUSCULAR | Status: DC | PRN
  Administered 2014-03-24: 30 mL

## 2014-03-24 MED ORDER — CEFAZOLIN SODIUM-DEXTROSE 2-3 GM-% IV SOLR
2.0000 g | INTRAVENOUS | Status: AC
  Administered 2014-03-24: 2 g via INTRAVENOUS

## 2014-03-24 MED ORDER — BUPIVACAINE HCL 0.25 % IJ SOLN
INTRAMUSCULAR | Status: DC | PRN
  Administered 2014-03-24: 20 mL

## 2014-03-24 MED ORDER — RIVAROXABAN 10 MG PO TABS
10.0000 mg | ORAL_TABLET | Freq: Every day | ORAL | Status: DC
  Administered 2014-03-25 – 2014-03-26 (×2): 10 mg via ORAL
  Filled 2014-03-24 (×3): qty 1

## 2014-03-24 MED ORDER — DEXAMETHASONE SODIUM PHOSPHATE 10 MG/ML IJ SOLN
10.0000 mg | Freq: Every day | INTRAMUSCULAR | Status: AC
  Filled 2014-03-24: qty 1

## 2014-03-24 MED ORDER — FLEET ENEMA 7-19 GM/118ML RE ENEM: 1.0000 | ENEMA | Freq: Once | RECTAL | Status: AC | PRN

## 2014-03-24 MED ORDER — BISACODYL 10 MG RE SUPP: 10.0000 mg | Freq: Every day | RECTAL | Status: DC | PRN

## 2014-03-24 MED ORDER — DEXAMETHASONE 6 MG PO TABS
10.0000 mg | ORAL_TABLET | Freq: Every day | ORAL | Status: AC
  Administered 2014-03-25: 10 mg via ORAL
  Filled 2014-03-24: qty 1

## 2014-03-24 MED ORDER — DIPHENHYDRAMINE HCL 12.5 MG/5ML PO ELIX: 12.5000 mg | ORAL_SOLUTION | ORAL | Status: DC | PRN

## 2014-03-24 MED ORDER — ACETAMINOPHEN 500 MG PO TABS
1000.0000 mg | ORAL_TABLET | Freq: Four times a day (QID) | ORAL | Status: AC
  Administered 2014-03-24 – 2014-03-25 (×4): 1000 mg via ORAL
  Filled 2014-03-24 (×4): qty 2

## 2014-03-24 MED ORDER — ACETAMINOPHEN 325 MG PO TABS: 650.0000 mg | ORAL_TABLET | Freq: Four times a day (QID) | ORAL | Status: DC | PRN

## 2014-03-24 MED ORDER — DEXTROSE-NACL 5-0.9 % IV SOLN
INTRAVENOUS | Status: DC
  Administered 2014-03-24 – 2014-03-25 (×3): via INTRAVENOUS

## 2014-03-24 MED ORDER — SODIUM CHLORIDE 0.9 % IJ SOLN
INTRAMUSCULAR | Status: AC
  Filled 2014-03-24: qty 50

## 2014-03-24 MED ORDER — PROPOFOL INFUSION 10 MG/ML OPTIME
INTRAVENOUS | Status: DC | PRN
  Administered 2014-03-24: 120 ug/kg/min via INTRAVENOUS

## 2014-03-24 MED ORDER — ONDANSETRON HCL 4 MG/2ML IJ SOLN
INTRAMUSCULAR | Status: DC | PRN
  Administered 2014-03-24: 4 mg via INTRAVENOUS

## 2014-03-24 MED ORDER — MIDAZOLAM HCL 2 MG/2ML IJ SOLN
INTRAMUSCULAR | Status: AC
  Filled 2014-03-24: qty 2

## 2014-03-24 MED ORDER — HYDROMORPHONE HCL PF 1 MG/ML IJ SOLN
0.2500 mg | INTRAMUSCULAR | Status: DC | PRN
  Administered 2014-03-24 (×3): 0.5 mg via INTRAVENOUS

## 2014-03-24 MED ORDER — METHOCARBAMOL 500 MG PO TABS
500.0000 mg | ORAL_TABLET | Freq: Four times a day (QID) | ORAL | Status: DC | PRN
  Administered 2014-03-24 – 2014-03-26 (×5): 500 mg via ORAL
  Filled 2014-03-24 (×5): qty 1

## 2014-03-24 MED ORDER — TRAMADOL HCL 50 MG PO TABS: 50.0000 mg | ORAL_TABLET | Freq: Four times a day (QID) | ORAL | Status: DC | PRN

## 2014-03-24 MED ORDER — MORPHINE SULFATE 2 MG/ML IJ SOLN
1.0000 mg | INTRAMUSCULAR | Status: DC | PRN
  Administered 2014-03-24: 2 mg via INTRAVENOUS
  Administered 2014-03-24: 1 mg via INTRAVENOUS
  Filled 2014-03-24 (×2): qty 1

## 2014-03-24 MED ORDER — SODIUM CHLORIDE 0.9 % IV SOLN: INTRAVENOUS | Status: DC

## 2014-03-24 MED ORDER — MEPERIDINE HCL 50 MG/ML IJ SOLN
6.2500 mg | INTRAMUSCULAR | Status: DC | PRN
Start: 2014-03-24 — End: 2014-03-24

## 2014-03-24 MED ORDER — BUPIVACAINE LIPOSOME 1.3 % IJ SUSP
INTRAMUSCULAR | Status: DC | PRN
  Administered 2014-03-24: 20 mL

## 2014-03-24 MED ORDER — METHOCARBAMOL 100 MG/ML IJ SOLN
500.0000 mg | Freq: Four times a day (QID) | INTRAVENOUS | Status: DC | PRN
  Administered 2014-03-24: 500 mg via INTRAVENOUS
  Filled 2014-03-24: qty 5

## 2014-03-24 MED ORDER — METOCLOPRAMIDE HCL 5 MG/ML IJ SOLN: 5.0000 mg | Freq: Three times a day (TID) | INTRAMUSCULAR | Status: DC | PRN

## 2014-03-24 MED ORDER — DEXAMETHASONE SODIUM PHOSPHATE 10 MG/ML IJ SOLN
INTRAMUSCULAR | Status: AC
  Filled 2014-03-24: qty 1

## 2014-03-24 MED ORDER — ONDANSETRON HCL 4 MG/2ML IJ SOLN: 4.0000 mg | Freq: Four times a day (QID) | INTRAMUSCULAR | Status: DC | PRN

## 2014-03-24 MED ORDER — CEFAZOLIN SODIUM-DEXTROSE 2-3 GM-% IV SOLR
INTRAVENOUS | Status: AC
  Filled 2014-03-24: qty 50

## 2014-03-24 MED ORDER — LACTATED RINGERS IV SOLN: INTRAVENOUS | Status: DC

## 2014-03-24 MED ORDER — POLYETHYLENE GLYCOL 3350 17 G PO PACK
17.0000 g | PACK | Freq: Every day | ORAL | Status: DC | PRN
  Administered 2014-03-25 – 2014-03-26 (×2): 17 g via ORAL

## 2014-03-24 MED ORDER — TRANEXAMIC ACID 100 MG/ML IV SOLN
1000.0000 mg | INTRAVENOUS | Status: AC
  Administered 2014-03-24: 1000 mg via INTRAVENOUS
  Filled 2014-03-24: qty 10

## 2014-03-24 MED ORDER — MENTHOL 3 MG MT LOZG
1.0000 | LOZENGE | OROMUCOSAL | Status: DC | PRN
Start: 2014-03-24 — End: 2014-03-26

## 2014-03-24 MED ORDER — ONDANSETRON HCL 4 MG PO TABS: 4.0000 mg | ORAL_TABLET | Freq: Four times a day (QID) | ORAL | Status: DC | PRN

## 2014-03-24 SURGICAL SUPPLY — 57 items
BAG ZIPLOCK 12X15 (MISCELLANEOUS) ×2 IMPLANT
BANDAGE ELASTIC 6 VELCRO ST LF (GAUZE/BANDAGES/DRESSINGS) ×2 IMPLANT
BANDAGE ESMARK 6X9 LF (GAUZE/BANDAGES/DRESSINGS) ×1 IMPLANT
BLADE SAG 18X100X1.27 (BLADE) ×2 IMPLANT
BLADE SAW SGTL 11.0X1.19X90.0M (BLADE) ×2 IMPLANT
BNDG ESMARK 6X9 LF (GAUZE/BANDAGES/DRESSINGS) ×2
BOWL SMART MIX CTS (DISPOSABLE) ×2 IMPLANT
CAP UPCHARGE REVISION TRAY ×2 IMPLANT
CAPT RP KNEE ×2 IMPLANT
CEMENT HV SMART SET (Cement) ×4 IMPLANT
CUFF TOURN SGL QUICK 34 (TOURNIQUET CUFF) ×1
CUFF TRNQT CYL 34X4X40X1 (TOURNIQUET CUFF) ×1 IMPLANT
DECANTER SPIKE VIAL GLASS SM (MISCELLANEOUS) ×2 IMPLANT
DRAPE EXTREMITY T 121X128X90 (DRAPE) ×2 IMPLANT
DRAPE POUCH INSTRU U-SHP 10X18 (DRAPES) ×2 IMPLANT
DRAPE U-SHAPE 47X51 STRL (DRAPES) ×2 IMPLANT
DRSG ADAPTIC 3X8 NADH LF (GAUZE/BANDAGES/DRESSINGS) ×2 IMPLANT
DRSG PAD ABDOMINAL 8X10 ST (GAUZE/BANDAGES/DRESSINGS) ×2 IMPLANT
DURAPREP 26ML APPLICATOR (WOUND CARE) ×2 IMPLANT
ELECT REM PT RETURN 9FT ADLT (ELECTROSURGICAL) ×2
ELECTRODE REM PT RTRN 9FT ADLT (ELECTROSURGICAL) ×1 IMPLANT
EVACUATOR 1/8 PVC DRAIN (DRAIN) ×2 IMPLANT
FACESHIELD WRAPAROUND (MASK) ×10 IMPLANT
GLOVE BIO SURGEON STRL SZ7.5 (GLOVE) IMPLANT
GLOVE BIO SURGEON STRL SZ8 (GLOVE) ×2 IMPLANT
GLOVE BIOGEL PI IND STRL 8 (GLOVE) ×2 IMPLANT
GLOVE BIOGEL PI INDICATOR 8 (GLOVE) ×2
GLOVE SURG SS PI 6.5 STRL IVOR (GLOVE) IMPLANT
GOWN STRL REUS W/TWL LRG LVL3 (GOWN DISPOSABLE) ×2 IMPLANT
GOWN STRL REUS W/TWL XL LVL3 (GOWN DISPOSABLE) IMPLANT
HANDPIECE INTERPULSE COAX TIP (DISPOSABLE) ×1
IMMOBILIZER KNEE 20 (SOFTGOODS) ×2 IMPLANT
KIT BASIN OR (CUSTOM PROCEDURE TRAY) ×2 IMPLANT
MANIFOLD NEPTUNE II (INSTRUMENTS) ×2 IMPLANT
NDL SAFETY ECLIPSE 18X1.5 (NEEDLE) ×2 IMPLANT
NEEDLE HYPO 18GX1.5 SHARP (NEEDLE) ×2
NS IRRIG 1000ML POUR BTL (IV SOLUTION) ×2 IMPLANT
PACK TOTAL JOINT (CUSTOM PROCEDURE TRAY) ×2 IMPLANT
PAD ABD 8X10 STRL (GAUZE/BANDAGES/DRESSINGS) ×2 IMPLANT
PADDING CAST COTTON 6X4 STRL (CAST SUPPLIES) ×2 IMPLANT
POSITIONER SURGICAL ARM (MISCELLANEOUS) ×2 IMPLANT
SET HNDPC FAN SPRY TIP SCT (DISPOSABLE) ×1 IMPLANT
SPONGE GAUZE 4X4 12PLY (GAUZE/BANDAGES/DRESSINGS) ×2 IMPLANT
STRIP CLOSURE SKIN 1/2X4 (GAUZE/BANDAGES/DRESSINGS) ×2 IMPLANT
SUCTION FRAZIER 12FR DISP (SUCTIONS) ×2 IMPLANT
SUT MNCRL AB 4-0 PS2 18 (SUTURE) ×2 IMPLANT
SUT VIC AB 2-0 CT1 27 (SUTURE) ×4
SUT VIC AB 2-0 CT1 TAPERPNT 27 (SUTURE) ×4 IMPLANT
SUT VLOC 180 0 24IN GS25 (SUTURE) ×2 IMPLANT
SYR 20CC LL (SYRINGE) ×2 IMPLANT
SYR 50ML LL SCALE MARK (SYRINGE) ×2 IMPLANT
TOWEL OR 17X26 10 PK STRL BLUE (TOWEL DISPOSABLE) ×2 IMPLANT
TOWEL OR NON WOVEN STRL DISP B (DISPOSABLE) IMPLANT
TRAY FOLEY CATH 14FRSI W/METER (CATHETERS) IMPLANT
TRAY FOLEY CATH 16FRSI W/METER (SET/KITS/TRAYS/PACK) ×2 IMPLANT
WATER STERILE IRR 1500ML POUR (IV SOLUTION) ×2 IMPLANT
WRAP KNEE MAXI GEL POST OP (GAUZE/BANDAGES/DRESSINGS) ×2 IMPLANT

## 2014-03-24 NOTE — Evaluation (Signed)
Physical Therapy Evaluation Patient Details Name: Damon Butler MRN: [id number] DOB: [birth year] Today's Date: 03/24/2014   History of Present Illness  s/p R TKA   Clinical Impression  Pt will benefit from PT to address deficits below, plan is for HHPT, home with pt Dad    Follow Up Recommendations Home health PT    Equipment Recommendations  None recommended by PT    Recommendations for Other Services       Precautions / Restrictions Precautions Precautions: Knee Required Braces or Orthoses: Knee Immobilizer - Right Knee Immobilizer - Right: Discontinue once straight leg raise with < 10 degree lag Restrictions Other Position/Activity Restrictions: WBAT      Mobility  Bed Mobility Overal bed mobility: Needs Assistance Bed Mobility: Supine to Sit     Supine to sit: Min assist     General bed mobility comments: cues for technique  Transfers Overall transfer level: Needs assistance Equipment used: Rolling walker (2 wheeled) Transfers: Sit to/from Stand Sit to Stand: Min assist;Min guard         General transfer comment: cues for hand placement, RLE position  Ambulation/Gait Ambulation/Gait assistance: Min assist;Min guard Ambulation Distance (Feet): 65 Feet Assistive device: Rolling walker (2 wheeled) Gait Pattern/deviations: Step-to pattern;Antalgic Gait velocity: decr   General Gait Details: cues for step length, RW distance from self  Stairs            Wheelchair Mobility    Modified Rankin (Stroke Patients Only)       Balance                                             Pertinent Vitals/Pain VSS    Home Living Family/patient expects to be discharged to:: Private residence Living Arrangements: Alone   Type of Home: House Home Access: Stairs to enter   Entergy CorporationEntrance Stairs-Number of Steps: 1 Home Layout: Two level;Able to live on main level with bedroom/bathroom;Multi-level Home Equipment: Crutches;Walker - 2  wheels      Prior Function Level of Independence: Independent               Hand Dominance        Extremity/Trunk Assessment   Upper Extremity Assessment: Overall WFL for tasks assessed           Lower Extremity Assessment: RLE deficits/detail RLE Deficits / Details: able to assist with SLR, ankle WFL       Communication   Communication: No difficulties  Cognition Arousal/Alertness: Awake/alert Behavior During Therapy: WFL for tasks assessed/performed Overall Cognitive Status: Within Functional Limits for tasks assessed                      General Comments      Exercises Total Joint Exercises Ankle Circles/Pumps: AROM;Both;5 reps Quad Sets: 5 reps;Both;AROM      Assessment/Plan    PT Assessment Patient needs continued PT services  PT Diagnosis Difficulty walking   PT Problem List Decreased strength;Decreased range of motion;Decreased mobility;Decreased knowledge of use of DME  PT Treatment Interventions Functional mobility training;Gait training;DME instruction;Therapeutic activities;Therapeutic exercise;Patient/family education   PT Goals (Current goals can be found in the Care Plan section) Acute Rehab PT Goals Patient Stated Goal: back to I  PT Goal Formulation: With patient Time For Goal Achievement: 03/28/14 Potential to Achieve Goals: Good    Frequency 7X/week   Barriers  to discharge        Co-evaluation               End of Session Equipment Utilized During Treatment: Gait belt;Right knee immobilizer Activity Tolerance: Patient tolerated treatment well Patient left: in chair;with call bell/phone within reach;with family/visitor present Nurse Communication: Mobility status         Time: 1455-1526 PT Time Calculation (min): 31 min   Charges:   PT Evaluation $Initial PT Evaluation Tier I: 1 Procedure PT Treatments $Gait Training: 23-37 mins   PT G Codes:          Laval Cafaro 03-30-2014, 3:33 PM

## 2014-03-24 NOTE — Plan of Care (Signed)
Problem: Phase I Progression Outcomes Goal: Pain controlled with appropriate interventions Outcome: Progressing Pt c/o pain 2/10, stating it is tolerable. Pt states his goal is 5/10, says "Anything will be better than the pain I had before surgery."

## 2014-03-24 NOTE — Interval H&P Note (Signed)
History and Physical Interval Note:  03/24/2014 6:56 AM  Damon Butler  has presented today for surgery, with the diagnosis of osteoarthritis of the right knee  The various methods of treatment have been discussed with the patient and family. After consideration of risks, benefits and other options for treatment, the patient has consented to  Procedure(s): RIGHT TOTAL KNEE ARTHROPLASTY (Right) as a surgical intervention .  The patient's history has been reviewed, patient examined, no change in status, stable for surgery.  I have reviewed the patient's chart and labs.  Questions were answered to the patient's satisfaction.     Loanne DrillingALUISIO,[name]

## 2014-03-24 NOTE — Anesthesia Preprocedure Evaluation (Signed)
Anesthesia Evaluation  Patient identified by MRN, date of birth, ID band Patient awake    Reviewed: Allergy & Precautions, H&P , NPO status , Patient's Chart, lab work & pertinent test results  Airway Mallampati: II TM Distance: >3 FB Neck ROM: Full    Dental no notable dental hx.    Pulmonary neg pulmonary ROS,  breath sounds clear to auscultation  Pulmonary exam normal       Cardiovascular hypertension, Pt. on medications Rhythm:Regular Rate:Normal     Neuro/Psych negative neurological ROS  negative psych ROS   GI/Hepatic negative GI ROS, Neg liver ROS,   Endo/Other  negative endocrine ROS  Renal/GU negative Renal ROS  negative genitourinary   Musculoskeletal negative musculoskeletal ROS (+)   Abdominal   Peds negative pediatric ROS (+)  Hematology negative hematology ROS (+)   Anesthesia Other Findings   Reproductive/Obstetrics negative OB ROS                          Anesthesia Physical Anesthesia Plan  ASA: II  Anesthesia Plan: Spinal   Post-op Pain Management:    Induction:   Airway Management Planned: Simple Face Mask  Additional Equipment:   Intra-op Plan:   Post-operative Plan:   Informed Consent: I have reviewed the patients History and Physical, chart, labs and discussed the procedure including the risks, benefits and alternatives for the proposed anesthesia with the patient or authorized representative who has indicated his/her understanding and acceptance.   Dental advisory given  Plan Discussed with: CRNA  Anesthesia Plan Comments:         Anesthesia Quick Evaluation  

## 2014-03-24 NOTE — Transfer of Care (Signed)
Immediate Anesthesia Transfer of Care Note  Patient: Damon GerlachLindsey Q Atiyeh  Procedure(s) Performed: Procedure(s) (LRB): RIGHT TOTAL KNEE ARTHROPLASTY WITH HARDWARE REMOVAL (Right)  Patient Location: PACU  Anesthesia Type: Spinal  Level of Consciousness: sedated, patient cooperative and responds to stimulation  Airway & Oxygen Therapy: Patient Spontanous Breathing and Patient connected to face mask oxgen  Post-op Assessment: Report given to PACU RN and Post -op Vital signs reviewed and stable  Post vital signs: Reviewed and stable  Complications: No apparent anesthesia complications

## 2014-03-24 NOTE — Anesthesia Postprocedure Evaluation (Signed)
  Anesthesia Post-op Note  Patient: Damon GerlachLindsey Q Tiemann  Procedure(s) Performed: Procedure(s) (LRB): RIGHT TOTAL KNEE ARTHROPLASTY WITH HARDWARE REMOVAL (Right)  Patient Location: PACU  Anesthesia Type: Spinal  Level of Consciousness: awake and alert   Airway and Oxygen Therapy: Patient Spontanous Breathing  Post-op Pain: mild  Post-op Assessment: Post-op Vital signs reviewed, Patient's Cardiovascular Status Stable, Respiratory Function Stable, Patent Airway and No signs of Nausea or vomiting  Last Vitals:  Filed Vitals:   03/24/14 1530  BP: 125/88  Pulse: 92  Temp: 36.7 C  Resp: 20    Post-op Vital Signs: stable   Complications: No apparent anesthesia complications
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
## 2014-03-24 NOTE — Anesthesia Procedure Notes (Signed)
Spinal  Patient location during procedure: OR Staffing Anesthesiologist: Rekha Hobbins Performed by: anesthesiologist  Preanesthetic Checklist Completed: patient identified, site marked, surgical consent, pre-op evaluation, timeout performed, IV checked, risks and benefits discussed and monitors and equipment checked Spinal Block Patient position: sitting Prep: Betadine Patient monitoring: heart rate, continuous pulse ox and blood pressure Location: L3-4 Injection technique: single-shot Needle Needle type: Spinocan  Needle gauge: 22 G Needle length: 9 cm Additional Notes Expiration date of kit checked and confirmed. Patient tolerated procedure well, without complications.     

## 2014-03-24 NOTE — Op Note (Signed)
Pre-operative diagnosis- Osteoarthritis  Right knee with retained hardware  Post-operative diagnosis- Osteoarthritis Right knee with retained hardware  Procedure-  Right  Total Knee Arthroplasty with hardware removal  Surgeon- Gus Rankin. Leanette Eutsler, MD  Assistant- Dimitri Ped, PA-C   Anesthesia-  Spinal EBL-* No blood loss amount entered *  Drains Hemovac  Tourniquet time-  Total Tourniquet Time Documented: Thigh (Right) - 85 minutes Total: Thigh (Right) - 85 minutes    Complications- None  Condition-PACU - hemodynamically stable.   Brief Clinical Note  Damon Butler is a 53 y.o. year old male with end stage OA of his right knee with progressively worsening pain and dysfunction. He has constant pain, with activity and at rest and significant functional deficits with difficulties even with ADLs. He has had extensive non-op management including analgesics, injections of cortisone, and home exercise program, but remains in significant pain with significant dysfunction. Radiographs show severe tricompartmental bone on bone changes with valgus deformity. He presents now for rightt Total Knee Arthroplasty.    Procedure in detail---   The patient is brought into the operating room and positioned supine on the operating table. After successful administration of  Spinal,   a tourniquet is placed high on the  Right thigh(s) and the lower extremity is prepped and draped in the usual sterile fashion. Time out is performed by the operating team and then the  Right lower extremity is wrapped in Esmarch, knee flexed and the tourniquet inflated to 300 mmHg.       A midline incision is made with a ten blade through the subcutaneous tissue to the level of the extensor mechanism. A fresh blade is used to make a medial parapatellar arthrotomy. Soft tissue over the proximal medial tibia is subperiosteally elevated to the joint line with a knife and into the semimembranosus bursa with a Cobb elevator.  Soft tissue over the proximal lateral tibia is elevated with attention being paid to avoiding the patellar tendon on the tibial tubercle. The patella is everted, knee flexed 90 degrees and the ACL and PCL are removed. Findings are tricompartmental bone on bone with massive global osteophytes.        The drill is used to create a starting hole in the distal femur and the canal is thoroughly irrigated with sterile saline to remove the fatty contents. The 5 degree Right  valgus alignment guide is placed into the femoral canal and the distal femoral cutting block is pinned to remove 11 mm off the distal femur. Resection is made with an oscillating saw.      The tibia is subluxed forward and the menisci are removed. The extramedullary alignment guide is placed referencing proximally at the medial aspect of the tibial tubercle and distally along the second metatarsal axis and tibial crest. The block is pinned to remove 2mm off the more deficient lateral  side. Resection is made with an oscillating saw. Size 4 is the most appropriate size for the tibia. The tibial screws were blocking access to the canal at the metaphyseal/diaphyseal junction and had to be removed. They were removed with a screwdriver. The proximal tibia is then prepared with the modular drill and keel punch for that size and preparation was made for the MBT revision tray to bypass the screw holes.      The femoral sizing guide is placed and size 5 is most appropriate. Rotation is marked off the epicondylar axis and confirmed by creating a rectangular flexion gap at 90 degrees. The  size 5 cutting block is pinned in this rotation and the anterior, posterior and chamfer cuts are made with the oscillating saw. The intercondylar block is then placed and that cut is made.      Trial size 4 tibial component, trial size 5 posterior stabilized femur and a 12.5  mm posterior stabilized rotating platform insert trial is placed. Full extension is achieved with  excellent varus/valgus and anterior/posterior balance throughout full range of motion. The patella is everted and thickness measured to be 25  mm. Free hand resection is taken to 15 mm, a 38 template is placed, lug holes are drilled, trial patella is placed, and it tracks normally. Osteophytes are removed off the posterior femur with the trial in place. All trials are removed and the cut bone surfaces prepared with pulsatile lavage. Cement is mixed and once ready for implantation, the size 4 MBT tibial implant, size  5 posterior stabilized femoral component, and the size 38 patella are cemented in place and the patella is held with the clamp. The trial insert is placed and the knee held in full extension. The Exparel (20 ml mixed with 30 ml saline) and .25% Bupivicaine, are injected into the extensor mechanism, posterior capsule, medial and lateral gutters and subcutaneous tissues.  All extruded cement is removed and once the cement is hard the permanent 12.5 mm posterior stabilized rotating platform insert is placed into the tibial tray.      The wound is copiously irrigated with saline solution and the extensor mechanism closed over a hemovac drain with #1 PDS suture. The tourniquet is released for a total tourniquet time of 85  minutes. Flexion against gravity is 140 degrees and the patella tracks normally. Subcutaneous tissue is closed with 2.0 vicryl and subcuticular with running 4.0 Monocryl. The incision is cleaned and dried and steri-strips and a bulky sterile dressing are applied. The limb is placed into a knee immobilizer and the patient is awakened and transported to recovery in stable condition.      Please note that a surgical assistant was a medical necessity for this procedure in order to perform it in a safe and expeditious manner. Surgical assistant was necessary to retract the ligaments and vital neurovascular structures to prevent injury to them and also necessary for proper positioning of the  limb to allow for anatomic placement of the prosthesis.   Gus RankinFrank V. Latysha Thackston, MD    03/24/2014, 11:05 AM

## 2014-03-25 LAB — BASIC METABOLIC PANEL
BUN: 6 mg/dL (ref 6–23)
CALCIUM: 8.7 mg/dL (ref 8.4–10.5)
CO2: 23 mEq/L (ref 19–32)
CREATININE: 0.79 mg/dL (ref 0.50–1.35)
Chloride: 97 mEq/L (ref 96–112)
GFR calc Af Amer: >90 mL/min (ref >90)
GLUCOSE: 129 mg/dL — AB (ref 70–99)
Potassium: 3.4 mEq/L — ABNORMAL LOW (ref 3.7–5.3)
SODIUM: 134 meq/L — AB (ref 137–147)

## 2014-03-25 LAB — CBC
HCT: 34.1 % — ABNORMAL LOW (ref 39.0–52.0)
HEMOGLOBIN: 11.9 g/dL — AB (ref 13.0–17.0)
MCH: 30.4 pg (ref 26.0–34.0)
MCHC: 34.9 g/dL (ref 30.0–36.0)
MCV: 87.2 fL (ref 78.0–100.0)
Platelets: 331 10*3/uL (ref 150–400)
RBC: 3.91 MIL/uL — ABNORMAL LOW (ref 4.22–5.81)
RDW: 12.4 % (ref 11.5–15.5)
WBC: 12.9 10*3/uL — ABNORMAL HIGH (ref 4.0–10.5)

## 2014-03-25 MED ORDER — OXYCODONE HCL 5 MG PO TABS: 5.0000 mg | ORAL_TABLET | ORAL | Status: DC | PRN

## 2014-03-25 MED ORDER — DSS 100 MG PO CAPS: 100.0000 mg | ORAL_CAPSULE | Freq: Two times a day (BID) | ORAL | Status: DC

## 2014-03-25 MED ORDER — METHOCARBAMOL 500 MG PO TABS: 500.0000 mg | ORAL_TABLET | Freq: Four times a day (QID) | ORAL | Status: DC | PRN

## 2014-03-25 MED ORDER — TRAMADOL HCL 50 MG PO TABS: 50.0000 mg | ORAL_TABLET | Freq: Four times a day (QID) | ORAL | Status: DC | PRN

## 2014-03-25 MED ORDER — RIVAROXABAN 10 MG PO TABS: 10.0000 mg | ORAL_TABLET | Freq: Every day | ORAL | Status: DC

## 2014-03-25 NOTE — Evaluation (Signed)
Occupational Therapy Evaluation Patient Details Name: Damon Butler MRN: [id number] DOB: [birth year] Today's Date: 03/25/2014    History of Present Illness s/p R TKA    Clinical Impression   Pt was admitted for the above surgery.  All education was completed. Pt does not need any further OT at this time.      Follow Up Recommendations  No OT follow up    Equipment Recommendations  3 in 1 bedside comode    Recommendations for Other Services       Precautions / Restrictions Precautions Precautions: Knee Required Braces or Orthoses: Knee Immobilizer - Right Restrictions Weight Bearing Restrictions: No      Mobility Bed Mobility         Supine to sit: Min assist     General bed mobility comments: assist for RLE  Transfers     Transfers: Sit to/from Stand Sit to Stand: Min guard         General transfer comment: cues for UE/LE placement    Balance                                            ADL Overall ADL's : Needs assistance/impaired     Grooming: Oral care;Standing;Supervision/safety               Lower Body Dressing: Minimal assistance;Sit to/from stand (used sock aide with L foot; not ready for R)   Toilet Transfer: Min guard;Ambulation;BSC           Functional mobility during ADLs: Min guard;Rolling walker General ADL Comments: Pt's father will assist him with LB ADLs.  He plans to wash at sink until he can step over tub. Educated on tub readiness.  Also discussed stool to step backwards on for bed if needed.       Vision                     Perception     Praxis      Pertinent Vitals/Pain 3-4/10 R knee.  Repositioned and ice applied     Hand Dominance     Extremity/Trunk Assessment Upper Extremity Assessment Upper Extremity Assessment: Overall WFL for tasks assessed           Communication Communication Communication: No difficulties   Cognition Arousal/Alertness:  Awake/alert Behavior During Therapy: WFL for tasks assessed/performed Overall Cognitive Status: Within Functional Limits for tasks assessed                     General Comments       Exercises       Shoulder Instructions      Home Living Family/patient expects to be discharged to:: Private residence                                 Additional Comments: pt will stay with his father.  He has a standard commode and tub/shower.  No DME      Prior Functioning/Environment Level of Independence: Independent             OT Diagnosis:     OT Problem List:     OT Treatment/Interventions:      OT Goals(Current goals can be found in the care plan section)    OT Frequency:  Barriers to D/C:            Co-evaluation              End of Session CPM Right Knee CPM Right Knee: Off  Activity Tolerance: Patient tolerated treatment well Patient left: in bed;with call bell/phone within reach   Time: 0865-7846 OT Time Calculation (min): 28 min Charges:  OT General Charges $OT Visit: 1 Procedure OT Evaluation $Initial OT Evaluation Tier I: 1 Procedure OT Treatments $Self Care/Home Management : 8-22 mins G-Codes:    Shaman Muscarella 04-01-2014, 9:21 AM Marica Otter, OTR/L [phone] 04/01/14

## 2014-03-25 NOTE — Discharge Instructions (Addendum)
° °Dr. Azile Minardi °Total Joint Specialist °[facility] °[address] °[place], [place] [address] °[phone] ° °TOTAL KNEE REPLACEMENT POSTOPERATIVE DIRECTIONS ° ° ° °Knee Rehabilitation, Guidelines Following Surgery  °Results after knee surgery are often greatly improved when you follow the exercise, range of motion and muscle strengthening exercises prescribed by your doctor. Safety measures are also important to protect the knee from further injury. Any time any of these exercises cause you to have increased pain or swelling in your knee joint, decrease the amount until you are comfortable again and slowly increase them. If you have problems or questions, call your caregiver or physical therapist for advice.  ° °HOME CARE INSTRUCTIONS  °Remove items at home which could result in a fall. This includes throw rugs or furniture in walking pathways.  °Continue medications as instructed at time of discharge. °You may have some home medications which will be placed on hold until you complete the course of blood thinner medication.  °You may start showering once you are discharged home but do not submerge the incision under water. Just pat the incision dry and apply a dry gauze dressing on daily. °Walk with walker as instructed.  °You may resume a sexual relationship in one month or when given the OK by  your doctor.  °· Use walker as long as suggested by your caregivers. °· Avoid periods of inactivity such as sitting longer than an hour when not asleep. This helps prevent blood clots.  °You may put full weight on your legs and walk as much as is comfortable.  °You may return to work once you are cleared by your doctor.  °Do not drive a car for 6 weeks or until released by you surgeon.  °· Do not drive while taking narcotics.  °Wear the elastic stockings for three weeks following surgery during the day but you may remove then at night. °Make sure you keep all of your appointments after your  operation with all of your doctors and caregivers. You should call the office at the above phone number and make an appointment for approximately two weeks after the date of your surgery. °Change the dressing daily and reapply a dry dressing each time. °Please pick up a stool softener and laxative for home use as long as you are requiring pain medications. °· Continue to use ice on the knee for pain and swelling from surgery. You may notice swelling that will progress down to the foot and ankle.  This is normal after surgery.  Elevate the leg when you are not up walking on it.   °It is important for you to complete the blood thinner medication as prescribed by your doctor. °· Continue to use the breathing machine which will help keep your temperature down.  It is common for your temperature to cycle up and down following surgery, especially at night when you are not up moving around and exerting yourself.  The breathing machine keeps your lungs expanded and your temperature down. ° °RANGE OF MOTION AND STRENGTHENING EXERCISES  °Rehabilitation of the knee is important following a knee injury or an operation. After just a few days of immobilization, the muscles of the thigh which control the knee become weakened and shrink (atrophy). Knee exercises are designed to build up the tone and strength of the thigh muscles and to improve knee motion. Often times heat used for twenty to thirty minutes before working out will loosen up your tissues and help with improving the   range of motion but do not use heat for the first two weeks following surgery. These exercises can be done on a training (exercise) mat, on the floor, on a table or on a bed. Use what ever works the best and is most comfortable for you Knee exercises include:  °Leg Lifts - While your knee is still immobilized in a splint or cast, you can do straight leg raises. Lift the leg to 60 degrees, hold for 3 sec, and slowly lower the leg. Repeat 10-20 times 2-3  times daily. Perform this exercise against resistance later as your knee gets better.  °Quad and Hamstring Sets - Tighten up the muscle on the front of the thigh (Quad) and hold for 5-10 sec. Repeat this 10-20 times hourly. Hamstring sets are done by pushing the foot backward against an object and holding for 5-10 sec. Repeat as with quad sets.  °A rehabilitation program following serious knee injuries can speed recovery and prevent re-injury in the future due to weakened muscles. Contact your doctor or a physical therapist for more information on knee rehabilitation.  ° °SKILLED REHAB INSTRUCTIONS: °If the patient is transferred to a skilled rehab facility following release from the hospital, a list of the current medications will be sent to the facility for the patient to continue.  When discharged from the skilled rehab facility, please have the facility set up the patient's Home Health Physical Therapy prior to being released. Also, the skilled facility will be responsible for providing the patient with their medications at time of release from the facility to include their pain medication, the muscle relaxants, and their blood thinner medication. If the patient is still at the rehab facility at time of the two week follow up appointment, the skilled rehab facility will also need to assist the patient in arranging follow up appointment in our office and any transportation needs. ° °MAKE SURE YOU:  °Understand these instructions.  °Will watch your condition.  °Will get help right away if you are not doing well or get worse.  ° ° °Pick up stool softner and laxative for home. °Do not submerge incision under water. °May shower. °Continue to use ice for pain and swelling from surgery. ° °======================================================= ° ° °Information on my medicine - XARELTO® (Rivaroxaban) ° °This medication education was reviewed with me or my healthcare representative as part of my discharge preparation.   The pharmacist that spoke with me during my hospital stay was:  Absher, Randall K, RPH ° °Why was Xarelto® prescribed for you? °Xarelto® was prescribed for you to reduce the risk of blood clots forming after orthopedic surgery. The medical term for these abnormal blood clots is venous thromboembolism (VTE). ° °What do you need to know about xarelto® ? °Take your Xarelto® ONCE DAILY at the same time every day. °You may take it either with or without food. ° °If you have difficulty swallowing the tablet whole, you may crush it and mix in applesauce just prior to taking your dose. ° °Take Xarelto® exactly as prescribed by your doctor and DO NOT stop taking Xarelto® without talking to the doctor who prescribed the medication.  Stopping without other VTE prevention medication to take the place of Xarelto® may increase your risk of developing a clot. ° °After discharge, you should have regular check-up appointments with your healthcare provider that is prescribing your Xarelto®.   ° °What do you do if you miss a dose? °If you miss a dose, take it as soon as   you remember on the same day then continue your regularly scheduled once daily regimen the next day. Do not take two doses of Xarelto® on the same day.  ° °Important Safety Information °A possible side effect of Xarelto® is bleeding. You should call your healthcare provider right away if you experience any of the following: °  Bleeding from an injury or your nose that does not stop. °  Unusual colored urine (red or dark brown) or unusual colored stools (red or black). °  Unusual bruising for unknown reasons. °  A serious fall or if you hit your head (even if there is no bleeding). ° °Some medicines may interact with Xarelto® and might increase your risk of bleeding while on Xarelto®. To help avoid this, consult your healthcare provider or pharmacist prior to using any new prescription or non-prescription medications, including herbals, vitamins, non-steroidal  anti-inflammatory drugs (NSAIDs) and supplements. ° °This website has more information on Xarelto®: www.xarelto.com. ° ° ° ° ° ° ° ° °

## 2014-03-25 NOTE — Progress Notes (Signed)
Physical Therapy Treatment Patient Details Name: Damon Butler MRN: [id number] DOB: [birth year] Today's Date: 03/25/2014    History of Present Illness s/p R TKA     PT Comments    Pt progressing, to D/C in am, will need to practice 6 stairs  Follow Up Recommendations  Home health PT     Equipment Recommendations  None recommended by PT    Recommendations for Other Services       Precautions / Restrictions Precautions Precautions: Knee Precaution Comments: I SLR with incr time Required Braces or Orthoses: Knee Immobilizer - Right Knee Immobilizer - Right: Discontinue once straight leg raise with < 10 degree lag Restrictions Other Position/Activity Restrictions: WBAT    Mobility  Bed Mobility Overal bed mobility: Needs Assistance Bed Mobility: Supine to Sit;Sit to Supine     Supine to sit: Min guard Sit to supine: Min guard   General bed mobility comments: assist for RLE  Transfers Overall transfer level: Needs assistance Equipment used: Rolling walker (2 wheeled) Transfers: Sit to/from Stand Sit to Stand: Supervision         General transfer comment: cues for UE/LE placement  Ambulation/Gait Ambulation/Gait assistance: Supervision Ambulation Distance (Feet): 140 Feet Assistive device: Rolling walker (2 wheeled) Gait Pattern/deviations: Step-to pattern;Step-through pattern         Stairs            Wheelchair Mobility    Modified Rankin (Stroke Patients Only)       Balance                                    Cognition Arousal/Alertness: Awake/alert Behavior During Therapy: WFL for tasks assessed/performed Overall Cognitive Status: Within Functional Limits for tasks assessed                      Exercises Total Joint Exercises Quad Sets: AROM;Right;5 reps Heel Slides: AROM;Right;5 reps (using sheet)    General Comments        Pertinent Vitals/Pain Min c/opain    Home Living                      Prior Function            PT Goals (current goals can now be found in the care plan section) Acute Rehab PT Goals Patient Stated Goal: back to I  PT Goal Formulation: With patient Time For Goal Achievement: 03/28/14 Potential to Achieve Goals: Good Progress towards PT goals: Progressing toward goals    Frequency  7X/week    PT Plan Current plan remains appropriate    Co-evaluation             End of Session   Activity Tolerance: Patient tolerated treatment well Patient left: in bed;with call bell/phone within reach     Time: 1459-1514 PT Time Calculation (min): 15 min  Charges:  $Gait Training: 8-22 mins                    G Codes:      Ting Cage 03/25/2014, 3:18 PM

## 2014-03-25 NOTE — Progress Notes (Signed)
Physical Therapy Treatment Patient Details Name: Damon GerlachLindsey Q Butler MRN: [id number] DOB: 03/30/1961 Today's Date: 03/25/2014    History of Present Illness s/p R TKA     PT Comments    Pt progressing;   Follow Up Recommendations  Home health PT     Equipment Recommendations  None recommended by PT    Recommendations for Other Services       Precautions / Restrictions Precautions Precautions: Knee Precaution Comments: I SLR with incr time Required Braces or Orthoses: Knee Immobilizer - Right Restrictions Weight Bearing Restrictions: No    Mobility  Bed Mobility Overal bed mobility: Needs Assistance Bed Mobility: Supine to Sit;Sit to Supine     Supine to sit: Min assist Sit to supine: Min assist   General bed mobility comments: assist for RLE  Transfers Overall transfer level: Needs assistance Equipment used: Rolling walker (2 wheeled) Transfers: Sit to/from Stand Sit to Stand: Min guard         General transfer comment: cues for UE/LE placement  Ambulation/Gait Ambulation/Gait assistance: Supervision Ambulation Distance (Feet): 120 Feet Assistive device: Rolling walker (2 wheeled) Gait Pattern/deviations: Step-through pattern;Decreased step length - right;Decreased step length - left Gait velocity: decr   General Gait Details: cues for step length, RW distance from self, right knee and hip flexion   Stairs            Wheelchair Mobility    Modified Rankin (Stroke Patients Only)       Balance                                    Cognition Arousal/Alertness: Awake/alert Behavior During Therapy: WFL for tasks assessed/performed Overall Cognitive Status: Within Functional Limits for tasks assessed                      Exercises Total Joint Exercises Ankle Circles/Pumps: AROM;Both;10 reps Quad Sets: 10 reps;AROM;Both;Strengthening Heel Slides: AROM;AAROM;Right;10 reps Straight Leg Raises:  AROM;AAROM;Strengthening;Right;10 reps Goniometric ROM: ~95*    General Comments        Pertinent Vitals/Pain 5/10 pain right knee, was premedicated    Home Living Family/patient expects to be discharged to:: Private residence               Additional Comments: pt will stay with his father.  He has a standard commode and tub/shower.  No DME    Prior Function Level of Independence: Independent          PT Goals (current goals can now be found in the care plan section) Acute Rehab PT Goals Patient Stated Goal: back to I  Time For Goal Achievement: 03/28/14 Potential to Achieve Goals: Good Progress towards PT goals: Progressing toward goals    Frequency  7X/week    PT Plan Current plan remains appropriate    Co-evaluation             End of Session   Activity Tolerance: Patient tolerated treatment well Patient left: in bed;with call bell/phone within reach     Time: 1050-1119 PT Time Calculation (min): 29 min  Charges:  $Gait Training: 8-22 mins $Therapeutic Exercise: 8-22 mins                    G Codes:      Damon Butler 03/25/2014, 12:39 PM

## 2014-03-25 NOTE — Progress Notes (Signed)
   Subjective: 1 Day Post-Op Procedure(s) (LRB): RIGHT TOTAL KNEE ARTHROPLASTY WITH HARDWARE REMOVAL (Right) Patient reports pain as moderate.   Patient seen in rounds with Dr. Lequita HaltAluisio. Patient is well, and has had no acute complaints or problems other than pain in the right knee. No issues overnight. No SOB or chest pain. We will start therapy today.  Plan is to go Home after hospital stay.  Objective: Vital signs in last 24 hours: Temp:  [97.3 F (36.3 C)-99.3 F (37.4 C)] 98.8 F (37.1 C) (04/07 0528) Pulse Rate:  [72-97] 85 (04/07 0528) Resp:  [15-20] 18 (04/07 0528) BP: (98-149)/(62-90) 143/87 mmHg (04/07 0528) SpO2:  [93 %-100 %] 97 % (04/07 0528) Weight:  [83.462 kg (184 lb)] 83.462 kg (184 lb) (04/06 1230)  Intake/Output from previous day:  Intake/Output Summary (Last 24 hours) at 03/25/14 0723 Last data filed at 03/25/14 0606  Gross per 24 hour  Intake 5201.67 ml  Output   6385 ml  Net -1183.33 ml     Labs:  Recent Labs  03/25/14 0353  HGB 11.9*    Recent Labs  03/25/14 0353  WBC 12.9*  RBC 3.91*  HCT 34.1*  PLT 331    Recent Labs  03/25/14 0353  NA 134*  K 3.4*  CL 97  CO2 23  BUN 6  CREATININE 0.79  GLUCOSE 129*  CALCIUM 8.7    EXAM General - Patient is Alert and Oriented Extremity - Neurologically intact Intact pulses distally Dorsiflexion/Plantar flexion intact Compartment soft Dressing - dressing C/D/I Motor Function - intact, moving foot and toes well on exam.  Hemovac pulled without difficulty.  Past Medical History  Diagnosis Date  . Allergy   . Heart murmur   . Anxiety   . Hypertension   . Sinus problem   . Arthritis     OA AND PAIN RT KNEE    Assessment/Plan: 1 Day Post-Op Procedure(s) (LRB): RIGHT TOTAL KNEE ARTHROPLASTY WITH HARDWARE REMOVAL (Right) Principal Problem:   OA (osteoarthritis) of knee  Estimated body mass index is 26.4 kg/(m^2) as calculated from the following:   Height as of this encounter: 5'  10" (1.778 m).   Weight as of this encounter: 83.462 kg (184 lb). Advance diet Up with therapy Plan for discharge tomorrow with home health  DVT Prophylaxis - Xarelto Weight-Bearing as tolerated D/C O2 and Pulse OX and try on Room Air   He is doing great. Will continue with therapy today. Plan for discharge tomorrow.   Emilene Roma LAUREN 03/25/2014, 7:23 AM

## 2014-03-25 NOTE — Progress Notes (Signed)
Utilization review completed.  

## 2014-03-25 NOTE — Progress Notes (Deleted)
   Subjective: 1 Day Post-Op Procedure(s) (LRB): RIGHT TOTAL KNEE ARTHROPLASTY WITH HARDWARE REMOVAL (Right) Patient reports pain as mild.   Patient seen in rounds with Dr. Lequita HaltAluisio. Patient is well, and has had no acute complaints or problems. No issues overnight other than poor sleep due to multiple interruptions. No SOB or chest pain.  We will start therapy today.  Plan is to go Home after hospital stay.  Objective: Vital signs in last 24 hours: Temp:  [97.3 F (36.3 C)-99.3 F (37.4 C)] 98.8 F (37.1 C) (04/07 0528) Pulse Rate:  [72-97] 85 (04/07 0528) Resp:  [15-20] 18 (04/07 0528) BP: (98-149)/(62-90) 143/87 mmHg (04/07 0528) SpO2:  [93 %-100 %] 97 % (04/07 0528) Weight:  [83.462 kg (184 lb)] 83.462 kg (184 lb) (04/06 1230)  Intake/Output from previous day:  Intake/Output Summary (Last 24 hours) at 03/25/14 0718 Last data filed at 03/25/14 0606  Gross per 24 hour  Intake 5201.67 ml  Output   6385 ml  Net -1183.33 ml     Labs:  Recent Labs  03/25/14 0353  HGB 11.9*    Recent Labs  03/25/14 0353  WBC 12.9*  RBC 3.91*  HCT 34.1*  PLT 331    Recent Labs  03/25/14 0353  NA 134*  K 3.4*  CL 97  CO2 23  BUN 6  CREATININE 0.79  GLUCOSE 129*  CALCIUM 8.7    EXAM General - Patient is Alert and Oriented Extremity - Neurologically intact Intact pulses distally Dorsiflexion/Plantar flexion intact Compartment soft Dressing - dressing C/D/I Motor Function - intact, moving foot and toes well on exam.  Hemovac pulled without difficulty.  Past Medical History  Diagnosis Date  . Allergy   . Heart murmur   . Anxiety   . Hypertension   . Sinus problem   . Arthritis     OA AND PAIN RT KNEE    Assessment/Plan: 1 Day Post-Op Procedure(s) (LRB): RIGHT TOTAL KNEE ARTHROPLASTY WITH HARDWARE REMOVAL (Right) Principal Problem:   OA (osteoarthritis) of knee  Estimated body mass index is 26.4 kg/(m^2) as calculated from the following:   Height as of  this encounter: 5\' 10"  (1.778 m).   Weight as of this encounter: 83.462 kg (184 lb). Advance diet Up with therapy Plan for discharge tomorrow  DVT Prophylaxis - Xarelto Weight-Bearing as tolerated  D/C O2 and Pulse OX and try on Room Air  Doing great. Will continue with PT and CPM. Plan for discharge home tomorrow with HHPT.  Ashely Joshua LAUREN 03/25/2014, 7:18 AM

## 2014-03-25 NOTE — Care Management Note (Signed)
    Page 1 of 2   03/25/2014     2:33:30 PM   CARE MANAGEMENT NOTE 03/25/2014  Patient:  Scherrie GerlachWILLIAMSON,Dariusz Q   Account Number:  [id number]  Date Initiated:  03/25/2014  Documentation initiated by:  [facility]FFRIES,Zayana Salvador  Subjective/Objective Assessment:   adm: RTKA     Action/Plan:   discharge planning   Anticipated DC Date:  03/26/2014   Anticipated DC Plan:  HOME W HOME HEALTH SERVICES      DC Planning Services  CM consult      [facility]   Choice offered to / List presented to:  C-1 Patient   DME arranged  3-N-1      DME agency  [facility]     HH arranged  HH[facility]2 [facility]      [facility]   Status of service:  Completed, signed off Medicare Important Message given?   (If response is "NO", the following Medicare IM given date fields will be blank) Date Medicare IM given:   Date Additional Medicare IM given:    Discharge Disposition:  HOME W HOME HEALTH SERVICES  Per UR Regulation:    If discussed at Long Length of Stay Meetings, dates discussed:    Comments:  03/25/14 14:20 CM spoke with pt in room for choice of home health service.  Pt chooses [facility] for HHPT.  Pt states he has a walker and would like the 3n1.  Pt will be recovering at his father's address: [address] [place], [place] [address].  Pt's contact number is correct on facesheet. [facility] contacted for 3n1.  Referral [facility], Debbie for HHPT.  No other CM needs were communicated.  Freddy JakschSarah Yader Criger, BSN, CM [phone].

## 2014-03-26 LAB — BASIC METABOLIC PANEL
BUN: 11 mg/dL (ref 6–23)
CALCIUM: 9 mg/dL (ref 8.4–10.5)
CO2: 26 mEq/L (ref 19–32)
Chloride: 95 mEq/L — ABNORMAL LOW (ref 96–112)
Creatinine, Ser: 0.82 mg/dL (ref 0.50–1.35)
GLUCOSE: 124 mg/dL — AB (ref 70–99)
POTASSIUM: 3.6 meq/L — AB (ref 3.7–5.3)
Sodium: 135 mEq/L — ABNORMAL LOW (ref 137–147)

## 2014-03-26 LAB — CBC
HCT: 33.1 % — ABNORMAL LOW (ref 39.0–52.0)
HEMOGLOBIN: 11.5 g/dL — AB (ref 13.0–17.0)
MCH: 30.4 pg (ref 26.0–34.0)
MCHC: 34.7 g/dL (ref 30.0–36.0)
MCV: 87.6 fL (ref 78.0–100.0)
Platelets: 312 10*3/uL (ref 150–400)
RBC: 3.78 MIL/uL — ABNORMAL LOW (ref 4.22–5.81)
RDW: 12.6 % (ref 11.5–15.5)
WBC: 14.7 10*3/uL — ABNORMAL HIGH (ref 4.0–10.5)

## 2014-03-26 NOTE — Progress Notes (Signed)
Physical Therapy Treatment Patient Details Name: Damon GerlachLindsey Q Butler MRN: [id number] DOB: 07-23-1961 Today's Date: 03/26/2014    History of Present Illness s/p R TKA     PT Comments    Pt mobilizing with crutches. For DC home  Follow Up Recommendations  Home health PT     Equipment Recommendations  Rolling walker with 5" wheels;3in1 (PT)    Recommendations for Other Services       Precautions / Restrictions Precautions Precautions: Knee Required Braces or Orthoses: Knee Immobilizer - Right Knee Immobilizer - Right: Discontinue once straight leg raise with < 10 degree lag    Mobility  Bed Mobility Overal bed mobility: Modified Independent Bed Mobility: Supine to Sit;Sit to Supine     Supine to sit: Modified independent (Device/Increase time) Sit to supine: Modified independent (Device/Increase time)      Transfers Overall transfer level: Needs assistance Equipment used: Crutches Transfers: Sit to/from Stand Sit to Stand: Supervision         General transfer comment: cues for UE/LE placement  Ambulation/Gait Ambulation/Gait assistance: Supervision Ambulation Distance (Feet): 200 Assistive device:crutches.    General Gait Details: cues for safety w/ crutches.   Stairs Wheelchair Mobility    Modified Rankin (Stroke Patients Only)       Balance                                    Cognition Arousal/Alertness: Awake/alert                          Exercises Total Joint Exercises Ankle Circles/Pumps: AROM;Both;10 reps;Supine Quad Sets: AROM;Right;5 reps;Supine Gluteal Sets: Supine Short Arc Quad: AAROM;Right;10 reps;Supine Heel Slides: AAROM;Right;10 reps;Supine Hip ABduction/ADduction: AAROM;Right;10 reps;Supine Straight Leg Raises: AAROM;Right;10 reps;Supine Goniometric ROM: 10-90    General Comments        Pertinent Vitals/Pain No pain    Home Living                      Prior Function             PT Goals (current goals can now be found in the care plan section) Progress towards PT goals: Progressing toward goals    Frequency  7X/week    PT Plan Current plan remains appropriate    Co-evaluation             End of Session Equipment Utilized During Treatment: Gait belt Activity Tolerance: Patient tolerated treatment well Patient left: in bed;with call bell/phone within reach;with nursing/sitter in room     Time: [id number] PT Time Calculation (min): 9 min  Charges:  $Gait Training: 8-22 mins $Therapeutic Exercise: 8-22 mins                    G Codes:      Rada HayKaren Elizabeth Dante Cooter 03/26/2014, 4:13 PM

## 2014-03-26 NOTE — Progress Notes (Signed)
   Subjective: 2 Days Post-Op Procedure(s) (LRB): RIGHT TOTAL KNEE ARTHROPLASTY WITH HARDWARE REMOVAL (Right) Patient reports pain as mild.   Patient seen in rounds with Dr. Lequita HaltAluisio. Patient is well, and has had no acute complaints or problems. He reports that therapy went well yesterday. Positive flatus. Voiding well. No issues overnight. No SOB or chest pain.  Plan is to go Home after hospital stay.  Objective: Vital signs in last 24 hours: Temp:  [98.2 F (36.8 C)-99.6 F (37.6 C)] 99.6 F (37.6 C) (04/08 0554) Pulse Rate:  [91-107] 107 (04/08 0554) Resp:  [16] 16 (04/08 0554) BP: (135-150)/(80-95) 139/80 mmHg (04/08 0554) SpO2:  [95 %-97 %] 97 % (04/08 0554)  Intake/Output from previous day:  Intake/Output Summary (Last 24 hours) at 03/26/14 0749 Last data filed at 03/26/14 0554  Gross per 24 hour  Intake   1490 ml  Output   3375 ml  Net  -1885 ml     Labs:  Recent Labs  03/25/14 0353 03/26/14 0400  HGB 11.9* 11.5*    Recent Labs  03/25/14 0353 03/26/14 0400  WBC 12.9* 14.7*  RBC 3.91* 3.78*  HCT 34.1* 33.1*  PLT 331 312    Recent Labs  03/25/14 0353 03/26/14 0400  NA 134* 135*  K 3.4* 3.6*  CL 97 95*  CO2 23 26  BUN 6 11  CREATININE 0.79 0.82  GLUCOSE 129* 124*  CALCIUM 8.7 9.0    EXAM General - Patient is Alert and Oriented Extremity - Neurologically intact Intact pulses distally Dorsiflexion/Plantar flexion intact No cellulitis present Compartment soft Dressing/Incision - clean, dry, no drainage Motor Function - intact, moving foot and toes well on exam.   Past Medical History  Diagnosis Date  . Allergy   . Heart murmur   . Anxiety   . Hypertension   . Sinus problem   . Arthritis     OA AND PAIN RT KNEE    Assessment/Plan: 2 Days Post-Op Procedure(s) (LRB): RIGHT TOTAL KNEE ARTHROPLASTY WITH HARDWARE REMOVAL (Right) Principal Problem:   OA (osteoarthritis) of knee  Estimated body mass index is 26.4 kg/(m^2) as calculated  from the following:   Height as of this encounter: 5\' 10"  (1.778 m).   Weight as of this encounter: 83.462 kg (184 lb). Advance diet Up with therapy D/C IV fluids Discharge home with home health  DVT Prophylaxis - Xarelto Weight-Bearing as tolerated    He is doing excellent. Will DC home after therapy this morning. Discharge instructions discussed.   Eva Vallee Tamala SerLauren Reonna Finlayson 03/26/2014, 7:49 AM
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
## 2014-03-26 NOTE — Progress Notes (Signed)
[facility]  Patient Status: Impatient  [facility] is providing the following services: [employer] (patient states that he is going to use his father's rw)  If patient discharges after hours, please call [phone].   Renard HamperLecretia Majano 03/26/2014, 10:54 AM

## 2014-03-26 NOTE — Progress Notes (Signed)
Physical Therapy Treatment Patient Details Name: Damon GerlachLindsey Q Butler MRN: [id number] DOB: 1961/06/29 Today's Date: 03/26/2014    History of Present Illness s/p R TKA     PT Comments    Pt does well with crutches but would like RW. Plan 1 more visit. Dc today is planned.  Follow Up Recommendations  Home health PT     Equipment Recommendations  Rolling walker with 5" wheels;3in1 (PT)    Recommendations for Other Services       Precautions / Restrictions Precautions Precautions: Knee Required Braces or Orthoses: Knee Immobilizer - Right Knee Immobilizer - Right: Discontinue once straight leg raise with < 10 degree lag    Mobility  Bed Mobility Overal bed mobility: Modified Independent Bed Mobility: Supine to Sit;Sit to Supine     Supine to sit: Modified independent (Device/Increase time) Sit to supine: Modified independent (Device/Increase time)      Transfers Overall transfer level: Needs assistance Equipment used: Rolling walker (2 wheeled) Transfers: Sit to/from Stand Sit to Stand: Supervision         General transfer comment: cues for UE/LE placement  Ambulation/Gait Ambulation/Gait assistance: Supervision Ambulation Distance (Feet): 400 Feet Assistive device: Rolling walker (2 wheeled) (400 with crutches.) Gait Pattern/deviations: Step-through pattern;Antalgic     General Gait Details: cues for step length, RW distance from self, right knee and hip flexion   Stairs Stairs: Yes Stairs assistance: Supervision Stair Management: Forwards;With crutches Number of Stairs: 4 General stair comments: cues for sequence.  Wheelchair Mobility    Modified Rankin (Stroke Patients Only)       Balance                                    Cognition Arousal/Alertness: Awake/alert                          Exercises Total Joint Exercises Ankle Circles/Pumps: AROM;Both;10 reps;Supine Quad Sets: AROM;Right;5 reps;Supine Gluteal  Sets: Supine Short Arc Quad: AAROM;Right;10 reps;Supine Heel Slides: AAROM;Right;10 reps;Supine Hip ABduction/ADduction: AAROM;Right;10 reps;Supine Straight Leg Raises: AAROM;Right;10 reps;Supine Goniometric ROM: 10-90    General Comments        Pertinent Vitals/Pain < 3 R knee    Home Living                      Prior Function            PT Goals (current goals can now be found in the care plan section) Progress towards PT goals: Progressing toward goals    Frequency  7X/week    PT Plan Current plan remains appropriate    Co-evaluation             End of Session Equipment Utilized During Treatment: Gait belt Activity Tolerance: Patient tolerated treatment well Patient left: in bed;with call bell/phone within reach     Time: 1131-1202 PT Time Calculation (min): 31 min  Charges:  $Gait Training: 8-22 mins $Therapeutic Exercise: 8-22 mins                    G Codes:      Rada HayKaren Elizabeth Tifany Hirsch 03/26/2014, 2:19 PM

## 2014-03-27 NOTE — Progress Notes (Signed)
Discharge summary sent to payer through [facility]  

## 2014-04-01 ENCOUNTER — Encounter (HOSPITAL_COMMUNITY): Payer: Self-pay | Admitting: Emergency Medicine

## 2014-04-01 ENCOUNTER — Emergency Department (HOSPITAL_COMMUNITY)
Admission: EM | Admit: 2014-04-01 | Discharge: 2014-04-01 | Disposition: A | Discharge status: Home / Self Care | Payer: BC Managed Care – PPO | Attending: Emergency Medicine | Admitting: Emergency Medicine

## 2014-04-01 DIAGNOSIS — I10 Essential (primary) hypertension: Secondary | ICD-10-CM | POA: Insufficient documentation

## 2014-04-01 DIAGNOSIS — Z79899 Other long term (current) drug therapy: Secondary | ICD-10-CM | POA: Insufficient documentation

## 2014-04-01 DIAGNOSIS — Z8739 Personal history of other diseases of the musculoskeletal system and connective tissue: Secondary | ICD-10-CM | POA: Insufficient documentation

## 2014-04-01 DIAGNOSIS — M25469 Effusion, unspecified knee: Secondary | ICD-10-CM | POA: Insufficient documentation

## 2014-04-01 DIAGNOSIS — Z7901 Long term (current) use of anticoagulants: Secondary | ICD-10-CM | POA: Insufficient documentation

## 2014-04-01 DIAGNOSIS — R011 Cardiac murmur, unspecified: Secondary | ICD-10-CM | POA: Insufficient documentation

## 2014-04-01 DIAGNOSIS — Z8709 Personal history of other diseases of the respiratory system: Secondary | ICD-10-CM | POA: Insufficient documentation

## 2014-04-01 DIAGNOSIS — T783XXA Angioneurotic edema, initial encounter: Secondary | ICD-10-CM | POA: Insufficient documentation

## 2014-04-01 DIAGNOSIS — F411 Generalized anxiety disorder: Secondary | ICD-10-CM | POA: Insufficient documentation

## 2014-04-01 MED ORDER — DIPHENHYDRAMINE HCL 25 MG PO CAPS
50.0000 mg | ORAL_CAPSULE | Freq: Once | ORAL | Status: AC
  Administered 2014-04-01: 50 mg via ORAL
  Filled 2014-04-01: qty 2

## 2014-04-01 MED ORDER — FAMOTIDINE 20 MG PO TABS
20.0000 mg | ORAL_TABLET | Freq: Once | ORAL | Status: AC
  Administered 2014-04-01: 20 mg via ORAL
  Filled 2014-04-01: qty 1

## 2014-04-01 NOTE — ED Notes (Signed)
Pt taking dose of home xarelto, tramadol and robaxin. Informed EDP.

## 2014-04-01 NOTE — ED Provider Notes (Signed)
CSN: [id number]     Arrival date & time 04/01/14  0720 History   First MD Initiated Contact with Patient 04/01/14 0730     Chief Complaint  Patient presents with  . Allergic Reaction  . Joint Swelling     (Consider location/radiation/quality/duration/timing/severity/associated sxs/prior Treatment) HPI Comments: Patient presents to the ER for evaluation of lower lip swelling. Patient reports that he awakened around 2 AM and felt some slight swelling on the left side of his lip. He stayed up for a URI at home, did not feel worsening of, eventually fell asleep. When he awakened this morning, however, the entire lip was swollen. No tongue or throat swelling. No difficulty breathing. Patient denies rash, itching, other allergic symptoms.  Patient is a 53 y.o. male presenting with allergic reaction.  Allergic Reaction Presenting symptoms: no difficulty swallowing     Past Medical History  Diagnosis Date  . Allergy   . Heart murmur   . Anxiety   . Hypertension   . Sinus problem   . Arthritis     OA AND PAIN RT KNEE   Past Surgical History  Procedure Laterality Date  . Keloid surgery      BACK OF NECK  1986 & 1988  . Fracture surgery  1982    RT KNEE  . 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 Hypertension Mother    History  Substance Use Topics  . Smoking status: Never Smoker   . Smokeless tobacco: Not on file  . Alcohol Use: 1.2 oz/week    2 Glasses of wine per week     Comment: all drinks-once a week 2 drinks    Review of Systems  HENT: Negative for sore throat, trouble swallowing and voice change.   Respiratory: Negative for shortness of breath.   Musculoskeletal: Positive for arthralgias.  All other systems reviewed and are negative.     Allergies  Review  of patient's allergies indicates no known allergies.  Home Medications   Prior to Admission medications   Medication Sig Start Date End Date Taking? Authorizing Provider  docusate sodium 100 MG CAPS Take 100 mg by mouth 2 (two) times daily. 03/25/14   Amber Tamala SerLauren Constable, PA-C  lisinopril (PRINIVIL,ZESTRIL) 10 MG tablet Take 10 mg by mouth daily with lunch.    Historical Provider, MD  methocarbamol (ROBAXIN) 500 MG tablet Take 1 tablet (500 mg total) by mouth every 6 (six) hours as needed for muscle spasms. 03/25/14   Amber Tamala SerLauren Constable, PA-C  NIFEdipine (PROCARDIA XL/ADALAT-CC) 90 MG 24 hr tablet Take 90 mg by mouth daily with lunch.    Historical Provider, MD  OVER THE COUNTER MEDICATION OTC SINUS MEDICATION IF NEEDED    Historical Provider, MD  oxyCODONE (OXY IR/ROXICODONE) 5 MG immediate release tablet Take 1-2 tablets (5-10 mg total) by mouth every 3 (three) hours as needed for breakthrough pain. 03/25/14   Amber Tamala SerLauren Constable, PA-C  rivaroxaban (XARELTO) 10 MG TABS tablet Take 1 tablet (10 mg total) by mouth daily with breakfast. 03/25/14   Amber Tamala SerLauren Constable, PA-C  traMADol (ULTRAM) 50 MG tablet Take 1-2 tablets (50-100 mg total) by mouth every 6 (six) hours as needed for moderate pain. 03/25/14   Amber Lauren Constable, PA-C   BP 133/91  Pulse 116  Temp(Src) 99.8 F (37.7 C) (Oral)  Resp 18  Ht 5\' 11"  (1.803 m)  Wt 185 lb (83.915 kg)  BMI 25.81 kg/m2  SpO2 99% Physical Exam  Constitutional: He is oriented to person, place, and time. He appears well-developed and well-nourished. No distress.  HENT:  Head: Normocephalic and atraumatic.  Right Ear: Hearing normal.  Left Ear: Hearing normal.  Nose: Nose normal.  Mouth/Throat: Oropharynx is clear and moist and mucous membranes are normal. No posterior oropharyngeal edema or posterior oropharyngeal erythema.    Eyes: Conjunctivae and EOM are normal. Pupils are equal, round, and reactive to light.  Neck: Normal range of  motion. Neck supple.  Cardiovascular: Regular rhythm, S1 normal and S2 normal.  Exam reveals no gallop and no friction rub.   No murmur heard. Pulmonary/Chest: Effort normal and breath sounds normal. No respiratory distress. He exhibits no tenderness.  Abdominal: Soft. Normal appearance and bowel sounds are normal. There is no hepatosplenomegaly. There is no tenderness. There is no rebound, no guarding, no tenderness at McBurney's point and negative Murphy's sign. No hernia.  Musculoskeletal: Normal range of motion.       Right knee: He exhibits effusion. He exhibits no ecchymosis and no erythema.       Legs: Neurological: He is alert and oriented to person, place, and time. He has normal strength. No cranial nerve deficit or sensory deficit. Coordination normal. GCS eye subscore is 4. GCS verbal subscore is 5. GCS motor subscore is 6.  Skin: Skin is warm, dry and intact. No rash noted. No cyanosis.  Psychiatric: He has a normal mood and affect. His speech is normal and behavior is normal. Thought content normal.    ED Course  Procedures (including critical care time) Labs Review Labs Reviewed - No data to display  Imaging Review No results found.   EKG Interpretation None      MDM   Final diagnoses:  None    Patient presents to the ER for evaluation of swelling of his lip. Patient has edema of the lip without any swelling or edema of the tongue the oropharynx. He was observed for a prolonged period of time here in the ER without any progression. No concern for airway involvement at this time. He was given return precautions for airway involvement. Symptoms are most likely secondary to lisinopril use. No signs of systemic allergic reaction. I do not suspect the pain medication is causing the edema.  Patient does report that he has had increased swelling of the knee after he tries to walk on it. He did have a knee replaced one week ago. Swelling is limited to the knee, no concern for  DVT. There is faint redness but some warmth around the knee, but surgical incision looks normal without any concern for drainage or infection. I suspect that the knee symptoms are secondary to postoperative effusion. He does not have a fever. This was discussed with his surgeon, Doctor Aluiso. He did not feel that the patient would require any further interventions. He recommends followup as already scheduled, but if his symptoms worsen he'll see the patient in the office. Patient is to call the office if there are worsening symptoms.    Gilda Creasehristopher J. Elwood Bazinet, MD 04/04/14 [phone]

## 2014-04-01 NOTE — ED Notes (Addendum)
Pt states he ate some fruit (has eaten before with no problems) and some pain medicine last night and woke up with bottom lip swelling. No hives/itching or swelling elsewhere. Pt on lisinopril. Pt had R knee swelling and redness. Pt alert, oriented, NAD.

## 2014-04-01 NOTE — Discharge Instructions (Signed)
Take Benadryl every 6 hours as needed for swelling. If you're swelling worsens, especially if you get tongue swelling, throat swelling, difficulty swallowing or difficulty breathing, return to the ER immediately  Doctor Aluisio will see you later in the week if you have increased swelling, redness. Call the office if pain, swelling, redness are worsening or you develop fever.  Angioedema Angioedema is sudden puffiness (swelling), often of the skin. It can happen:  On your face or privates (genitals).  In your belly (abdomen) or other body parts. It usually happens quickly and gets better in 1 or 2 days. It often starts at night and is found when you wake up. You may get red, itchy patches of skin (hives). Attacks can be dangerous if your breathing passages get puffy. The condition may happen only once, or it can come back at random times. It may happen for several years before it goes away for good. HOME CARE  Only take medicines as told by your doctor.  Always carry your emergency allergy medicines with you.  Wear a medical bracelet as told by your doctor.  Avoid things that you know will cause attacks (triggers). GET HELP IF:  You have another attack.  Your attacks happen more often or get worse.  The condition was passed to you by your parents and you want to have children. GET HELP RIGHT AWAY IF:   Your mouth, tongue, or lips are very puffy.  You have trouble breathing.  You have trouble swallowing.  You pass out (faint). MAKE SURE YOU:   Understand these instructions.  Will watch your condition.  Will get help right away if you are not doing well or get worse. Document Released: 11/23/2009 Document Revised: 09/25/2013 Document Reviewed: 07/29/2013 [facility] Patient Information 2014 [employer], [place].

## 2014-07-01 ENCOUNTER — Telehealth: Payer: Self-pay

## 2014-07-01 NOTE — Telephone Encounter (Signed)
Pt had angioedema to Lisinopril and they stopped it in the ER. He is also having leg swelling. He is coming in tomorrow to see Dr. Neva SeatGreene. FYI

## 2014-07-01 NOTE — Telephone Encounter (Signed)
° ° °  Dr Neva SeatGreene patient had an allergic reaction to one of the blood pressure medications.  He went to the ER and they stopped the medication.   Patient wants to discuss this with you.   [phone]

## 2014-07-01 NOTE — Telephone Encounter (Signed)
lmom for pt to cb with more details concerning this reaction.

## 2014-07-02 ENCOUNTER — Ambulatory Visit (INDEPENDENT_AMBULATORY_CARE_PROVIDER_SITE_OTHER): Payer: BC Managed Care – PPO | Admitting: Family Medicine

## 2014-07-02 VITALS — BP 142/80 | HR 92 | Temp 99.1°F | Resp 18 | Ht 70.0 in | Wt 180.0 lb

## 2014-07-02 DIAGNOSIS — Z9889 Other specified postprocedural states: Secondary | ICD-10-CM

## 2014-07-02 DIAGNOSIS — M7989 Other specified soft tissue disorders: Secondary | ICD-10-CM

## 2014-07-02 DIAGNOSIS — M25476 Effusion, unspecified foot: Secondary | ICD-10-CM

## 2014-07-02 DIAGNOSIS — Z5189 Encounter for other specified aftercare: Secondary | ICD-10-CM

## 2014-07-02 DIAGNOSIS — T783XXA Angioneurotic edema, initial encounter: Secondary | ICD-10-CM

## 2014-07-02 DIAGNOSIS — M25473 Effusion, unspecified ankle: Secondary | ICD-10-CM

## 2014-07-02 DIAGNOSIS — I10 Essential (primary) hypertension: Secondary | ICD-10-CM

## 2014-07-02 DIAGNOSIS — T783XXD Angioneurotic edema, subsequent encounter: Secondary | ICD-10-CM

## 2014-07-02 MED ORDER — NIFEDIPINE ER OSMOTIC RELEASE 60 MG PO TB24: 60.0000 mg | ORAL_TABLET | Freq: Every day | ORAL | Status: DC

## 2014-07-02 MED ORDER — HYDROCHLOROTHIAZIDE 12.5 MG PO CAPS: 12.5000 mg | ORAL_CAPSULE | Freq: Every day | ORAL | Status: DC

## 2014-07-02 NOTE — Progress Notes (Signed)
This chart was scribed for Damon StaggersJeffrey Aaronmichael Brumbaugh, MD by Andrew Auaven Small, ED Scribe. This patient was seen in room 11 and the patient's care was started at 7:36 PM. Subjective:    Patient ID: Damon Butler, male    DOB: 1961/03/27, 53 y.o.   MRN: [id number]  HPI Chief Complaint  Patient presents with  . Medication Problem    BP meds   HPI Comments: Damon Butler is a 53 y.o. male who presents to the [facility] regarding BP medication. This is a regular pt of mine with h.o HTN. Last ov feb 2015. HTN controlled at that time. 120/80 at home. On lisinopril 10mg  daily and nifedipine 90mg  QD. Was seen April 14 in ED diagnosed with angiodema with bottom lip swelling thought to be due to lisinopril and was on pain medication at that time for knee surgery. He has right total knee performed on April 6th 2015. With apparent complication per chart  Today, pt states his BP has been running 130-140/80-90 at home. He reports changing his diet and exercises. He reports ankle and foot swelling about one month ago in which he was seen June 30th by Dr. Lequita HaltAluisio. He reports Dr. Lequita HaltAluisio portrayed the swelling to be normal post surgery. He reports since then right knee and leg swelling. Pt reports swelling in leg prior to knee surgery. Pt is concerned that this may be from his medication. He denies CP and calf pain.  He reports a little swelling to left ankle. Pt denies blood thinners. Pt states he only takes fish oil and nifedipine.   Patient Active Problem List   Diagnosis Date Noted  . OA (osteoarthritis) of knee 03/24/2014  . Other and unspecified hyperlipidemia 08/12/2013  . Acne 08/12/2013  . HTN (hypertension) 11/12/2012   Past Medical History  Diagnosis Date  . Allergy   . Heart murmur   . Anxiety   . Hypertension   . Sinus problem   . Arthritis     OA AND PAIN RT KNEE   Past Surgical History  Procedure Laterality Date  . Keloid surgery      BACK OF NECK  1986 & 1988  .  Fracture surgery  1982    RT KNEE  . 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 Lisinopril    Prior to Admission medications   Medication Sig Start Date End Date Taking? Authorizing Provider  aspirin 81 MG tablet Take 81 mg by mouth daily.   Yes Historical Provider, MD  Fish Oil-Cholecalciferol (FISH OIL + D3 PO) Take by mouth.   Yes Historical Provider, MD  methocarbamol (ROBAXIN) 500 MG tablet Take 1 tablet (500 mg total) by mouth every 6 (six) hours as needed for muscle spasms. 03/25/14  Yes Amber Tamala SerLauren Constable, PA-C  NIFEdipine (PROCARDIA XL/ADALAT-CC) 90 MG 24 hr tablet Take 90 mg by mouth daily with lunch.   Yes Historical Provider, MD   History   Social History  . Marital Status: Single    Spouse Name: N/A    Number of Children: N/A  . Years of Education: college   Occupational History  . auto [employer]    Social History Main Topics  . Smoking status: Never Smoker   . Smokeless tobacco: Not on file  . Alcohol Use: 1.2 oz/week  2 Glasses of wine per week     Comment: all drinks-once a week 2 drinks  . Drug Use: No  . Sexual Activity: Yes    Birth Control/ Protection: Condom     Comment: sex partners in the last 12 months-2   Other Topics Concern  . Not on file   Social History Narrative  . No narrative on file   Review of Systems  Constitutional: Negative for fatigue and unexpected weight change.  Eyes: Negative for visual disturbance.  Respiratory: Negative for cough, chest tightness and shortness of breath.   Cardiovascular: Negative for chest pain, palpitations and leg swelling.  Gastrointestinal: Negative for abdominal pain and blood in stool.  Neurological: Negative for dizziness, light-headedness and headaches.   Objective:   Physical Exam  Nursing note and  vitals reviewed. Constitutional: He is oriented to person, place, and time. He appears well-developed and well-nourished. No distress.  HENT:  Head: Normocephalic and atraumatic.  Eyes: Conjunctivae and EOM are normal. Pupils are equal, round, and reactive to light.  Neck: Neck supple. No JVD present. Carotid bruit is not present.  Cardiovascular: Normal rate, regular rhythm and normal heart sounds.   No murmur heard. Pulmonary/Chest: Effort normal and breath sounds normal. He has no rales.  Musculoskeletal: Normal range of motion. He exhibits no edema.  Right knee well healed scar with diffuse swelling no redness no wounds. Pedal edema into right lower pre tibular area 2+ edema to right ankle. DP 2+. cap refill <1 sec. Toes are warm. Left ankle minimal pedal edema. Left calf non tender. Homan's negative. Calfs equal at 35 cm measured at 15 cm below patellar   Neurological: He is alert and oriented to person, place, and time.  Skin: Skin is warm and dry.  Psychiatric: He has a normal mood and affect. His behavior is normal.   Assessment & Plan:   Damon Butler is a 53 y.o. male History of right knee surgery, Ankle swelling, unspecified laterality, Right leg swelling - Plan: Lower Extremity Venous Duplex Right  - suspected dependent edema with knee swelling, but with prior surgery - will check doppler.   - intermittent bilateral ankle swelling, with taking Nifedipine - may be contributory - will decrease dose to 60mg  QD, add hctz 12.5mg  qd and recheck in 1 month. rtc precautions.   Essential hypertension - Plan: NIFEdipine (PROCARDIA XL/ADALAT-CC) 60 MG 24 hr tablet, hydrochlorothiazide (MICROZIDE) 12.5 MG capsule  - as above.  Check home Bp's with new regimen.   Angioedema, subsequent encounter.   -Resolved off Ace-I without recurrence. Avoid ACE-I and ARB in future.   Meds ordered this encounter  Medications  . aspirin 81 MG tablet    Sig: Take 81 mg by mouth daily.  . Fish  Oil-Cholecalciferol (FISH OIL + D3 PO)    Sig: Take by mouth.  Marland Kitchen NIFEdipine (PROCARDIA XL/ADALAT-CC) 60 MG 24 hr tablet    Sig: Take 1 tablet (60 mg total) by mouth daily with lunch.    Dispense:  30 tablet    Refill:  2  . hydrochlorothiazide (MICROZIDE) 12.5 MG capsule    Sig: Take 1 capsule (12.5 mg total) by mouth daily.    Dispense:  30 capsule    Refill:  2   Patient Instructions  Your blood pressure is close to goal, but with the ankle swelling, can decrease the dose of nifedipine as this can cause some ankle swelling. Will restart a low dose diuretic - HCTZ 12.5mg  each day.  Keep a record of your blood pressures outside of the office and bring them to the next office visit. If lightheaded/dizzy - stop the diuretic and return to discuss further. Keep appointment with me next month to recheck blood pressure. We will schedule an ultrasound of your calf, but the swelling is not likely to be from a blood clot. More likely from the swelling in your knee- keep your follow up with Dr. Lequita Halt.   Keep up with exercise and diet changes to keep the blood pressure controlled.   No further ACE-inhibitors or "Angiotension Receptor Blockers" (also called ARB's) due to your reaction with lisinopril.   Return to the clinic or go to the nearest emergency room if any of your symptoms worsen or new symptoms occur.    I personally performed the services described in this documentation, which was scribed in my presence. The recorded information has been reviewed and considered, and addended by me as needed.

## 2014-07-02 NOTE — Patient Instructions (Addendum)
Your blood pressure is close to goal, but with the ankle swelling, can decrease the dose of nifedipine as this can cause some ankle swelling. Will restart a low dose diuretic - HCTZ 12.5mg  each day. Keep a record of your blood pressures outside of the office and bring them to the next office visit. If lightheaded/dizzy - stop the diuretic and return to discuss further. Keep appointment with me next month to recheck blood pressure. We will schedule an ultrasound of your calf, but the swelling is not likely to be from a blood clot. More likely from the swelling in your knee- keep your follow up with Dr. Lequita HaltAluisio.   Keep up with exercise and diet changes to keep the blood pressure controlled.   No further ACE-inhibitors or "Angiotension Receptor Blockers" (also called ARB's) due to your reaction with lisinopril.   Return to the clinic or go to the nearest emergency room if any of your symptoms worsen or new symptoms occur.

## 2014-07-07 ENCOUNTER — Ambulatory Visit (HOSPITAL_COMMUNITY): Payer: BC Managed Care – PPO

## 2014-07-07 ENCOUNTER — Ambulatory Visit (HOSPITAL_COMMUNITY)
Admission: RE | Admit: 2014-07-07 | Discharge: 2014-07-07 | Disposition: A | Discharge status: Home / Self Care | Payer: BC Managed Care – PPO | Source: Ambulatory Visit | Attending: Family Medicine | Admitting: Family Medicine

## 2014-07-07 DIAGNOSIS — M7989 Other specified soft tissue disorders: Secondary | ICD-10-CM

## 2014-07-07 DIAGNOSIS — M79609 Pain in unspecified limb: Secondary | ICD-10-CM

## 2014-07-07 DIAGNOSIS — Z9889 Other specified postprocedural states: Secondary | ICD-10-CM

## 2014-07-07 DIAGNOSIS — IMO0002 Reserved for concepts with insufficient information to code with codable children: Secondary | ICD-10-CM | POA: Insufficient documentation

## 2014-07-07 DIAGNOSIS — M1710 Unilateral primary osteoarthritis, unspecified knee: Secondary | ICD-10-CM | POA: Insufficient documentation

## 2014-07-07 NOTE — Progress Notes (Signed)
Right lower extremity venous duplex completed.  Right:  No evidence of DVT, superficial thrombosis, or Baker's cyst.  Left:  Negative for DVT in the common femoral vein.  

## 2014-08-04 ENCOUNTER — Telehealth: Payer: Self-pay

## 2014-08-04 NOTE — Telephone Encounter (Signed)
Pt has an appt with Dr. Neva SeatGreene on 8/24 for follow up. Please advise if pt should move up appt or come to the Urgent Care sooner. He has fluid in the knee he is not able to have corrected. He is wondering if Dr. Neva SeatGreene would be willing to drain this in the office or if he should go to the ortho surgeon.

## 2014-08-04 NOTE — Telephone Encounter (Signed)
Pt of Dr. Chilton SiGreen called in because he is starting to develop fluid on his " non-surgical knee." He mentioned that Dr. Chilton SiGreen had been working with him on his medications to reduce the swelling in his surgical knee, and he does have a follow up appointment scheduled on 8/24, but  he would like some advise from Dr. Neva SeatGreene on whether or not he should schedule an appointment with his surgeon or if he should come in to our office and have this drained? Please advise pt.

## 2014-08-04 NOTE — Telephone Encounter (Signed)
If swelling in knee that did NOT have surgery - I can see him and possible injection or fluid withdrawal, but if in knee form surgery - needs to be seen by ortho for this.

## 2014-08-05 NOTE — Telephone Encounter (Signed)
Spoke to pt- he is going to come in this evening. He states the swelling is in the knee that did not have sx.

## 2014-08-06 ENCOUNTER — Ambulatory Visit (INDEPENDENT_AMBULATORY_CARE_PROVIDER_SITE_OTHER): Payer: BC Managed Care – PPO | Admitting: Family Medicine

## 2014-08-06 VITALS — BP 120/80 | HR 92 | Temp 98.2°F | Resp 16 | Ht 69.0 in | Wt 179.8 lb

## 2014-08-06 DIAGNOSIS — M25469 Effusion, unspecified knee: Secondary | ICD-10-CM

## 2014-08-06 DIAGNOSIS — M7040 Prepatellar bursitis, unspecified knee: Secondary | ICD-10-CM

## 2014-08-06 DIAGNOSIS — M25462 Effusion, left knee: Secondary | ICD-10-CM

## 2014-08-06 DIAGNOSIS — M7042 Prepatellar bursitis, left knee: Secondary | ICD-10-CM

## 2014-08-06 NOTE — Patient Instructions (Signed)
Ace bandage for pressure to area next week or two. If any redness, discharge, increased swelling or other signs of infection - return for recheck.   Prepatellar Bursitis with Rehab  Bursitis is a condition that is characterized by inflammation of a bursa. Kateri Mc exists in many areas of the body. They are fluid-filled sacs that lie between a soft tissue (skin, tendon, or ligament) and a bone, and they reduce friction between the structures as well as the stress placed on the soft tissue. Prepatellar bursitis is inflammation of the bursa that lies between the skin and the kneecap (patella). This condition often causes pain over the patella. SYMPTOMS   Pain, tenderness, and/or inflammation over the patella.  Pain that worsens with movement of the knee joint.  Decreased range of motion for the knee joint.  A crackling sound (crepitation) when the bursa is moved or touched.  Occasionally, painless swelling of the bursa.  Fever (when infected). CAUSES  Bursitis is caused by damage to the bursa, which results in an inflammatory response. Common mechanisms of injury include:  Direct trauma to the front of the knee.  Repetitive and/or stressful use of the knee. RISK INCREASES WITH:  Activities in which kneeling and/or falling on one's knees is likely (volleyball or football).  Repetitive and stressful training, especially if it involves running on hills.  Improper training techniques, such as a sudden increase in the intensity, frequency, or duration of training.  Failure to warm up properly before activity.  Poor technique.  Artificial turf. PREVENTION   Avoid kneeling or falling on your knees.  Warm up and stretch properly before activity.  Allow for adequate recovery between workouts.  Maintain physical fitness:  Strength, flexibility, and endurance.  Cardiovascular fitness.  Learn and use proper technique. When possible, have a coach correct improper technique.  Wear  properly fitted and padded protective equipment (kneepads). PROGNOSIS  If treated properly, then the symptoms of prepatellar bursitis usually resolve within 2 weeks. RELATED COMPLICATIONS   Recurrent symptoms that result in a chronic problem.  Prolonged healing time, if improperly treated or reinjured.  Limited range of motion.  Infection of bursa.  Chronic inflammation or scarring of bursa. TREATMENT  Treatment initially involves the use of ice and medication to help reduce pain and inflammation. The use of strengthening and stretching exercises may help reduce pain with activity, especially those of the quadriceps and hamstring muscles. These exercises may be performed at home or with referral to a therapist. Your caregiver may recommend kneepads when you return to playing sports, in order to reduce the stress on the prepatellar bursa. If symptoms persist despite treatment, then your caregiver may drain fluid out with a needle (aspirate) the bursa. If symptoms persist for greater than 6 months despite nonsurgical (conservative) treatment, then surgery may be recommended to remove the bursa.  MEDICATION  If pain medication is necessary, then nonsteroidal anti-inflammatory medications, such as aspirin and ibuprofen, or other minor pain relievers, such as acetaminophen, are often recommended.  Do not take pain medication for 7 days before surgery.  Prescription pain relievers may be given if deemed necessary by your caregiver. Use only as directed and only as much as you need.  Corticosteroid injections may be given by your caregiver. These injections should be reserved for the most serious cases, because they may only be given a certain number of times. HEAT AND COLD  Cold treatment (icing) relieves pain and reduces inflammation. Cold treatment should be applied for 10 to 15  minutes every 2 to 3 hours for inflammation and pain and immediately after any activity that aggravates your  symptoms. Use ice packs or massage the area with a piece of ice (ice massage).  Heat treatment may be used prior to performing the stretching and strengthening activities prescribed by your caregiver, physical therapist, or athletic trainer. Use a heat pack or soak the injury in warm water. SEEK MEDICAL CARE IF:  Treatment seems to offer no benefit, or the condition worsens.  Any medications produce adverse side effects. EXERCISES RANGE OF MOTION (ROM) AND STRETCHING EXERCISES - Prepatellar Bursitis These exercises may help you when beginning to rehabilitate your injury. Your symptoms may resolve with or without further involvement from your physician, physical therapist or athletic trainer. While completing these exercises, remember:   Restoring tissue flexibility helps normal motion to return to the joints. This allows healthier, less painful movement and activity.  An effective stretch should be held for at least 30 seconds.  A stretch should never be painful. You should only feel a gentle lengthening or release in the stretched tissue. STRETCH - Hamstrings, Standing  Stand or sit and extend your right / left leg, placing your foot on a chair or foot stool  Keeping a slight arch in your low back and your hips straight forward.  Lead with your chest and lean forward at the waist until you feel a gentle stretch in the back of your right / left knee or thigh. (When done correctly, this exercise requires leaning only a small distance.)  Hold this position for __________ seconds. Repeat __________ times. Complete this stretch __________ times per day. STRETCH - Quadriceps, Prone   Lie on your stomach on a firm surface, such as a bed or padded floor.  Bend your right / left knee and grasp your ankle. If you are unable to reach, your ankle or pant leg, use a belt around your foot to lengthen your reach.  Gently pull your heel toward your buttocks. Your knee should not slide out to the  side. You should feel a stretch in the front of your thigh and/or knee.  Hold this position for __________ seconds. Repeat __________ times. Complete this stretch __________ times per day.  STRETCH - Hamstrings/Adductors, V-Sit   Sit on the floor with your legs extended in a large "V," keeping your knees straight.  With your head and chest upright, bend at your waist reaching for your right foot to stretch your left adductors.  You should feel a stretch in your left inner thigh. Hold for __________ seconds.  Return to the upright position to relax your leg muscles.  Continuing to keep your chest upright, bend straight forward at your waist to stretch your hamstrings.  You should feel a stretch behind both of your thighs and/or knees. Hold for __________ seconds.  Return to the upright position to relax your leg muscles.  Repeat steps 2 through 4. Repeat __________ times. Complete this exercise __________ times per day.  STRENGTHENING EXERCISES - Prepatellar Bursitis  These exercises may help you when beginning to rehabilitate your injury. They may resolve your symptoms with or without further involvement from your physician, physical therapist or athletic trainer. While completing these exercises, remember:  Muscles can gain both the endurance and the strength needed for everyday activities through controlled exercises.  Complete these exercises as instructed by your physician, physical therapist or athletic trainer. Progress the resistance and repetitions only as guided. STRENGTH - Quadriceps, Isometrics  Lie on  your back with your right / left leg extended and your opposite knee bent.  Gradually tense the muscles in the front of your right / left thigh. You should see either your kneecap slide up toward your hip or increased dimpling just above the knee. This motion will push the back of the knee down toward the floor/mat/bed on which you are lying.  Hold the muscle as tight as  you can without increasing your pain for __________ seconds.  Relax the muscles slowly and completely in between each repetition. Repeat __________ times. Complete this exercise __________ times per day.  STRENGTH - Quadriceps, Short Arcs   Lie on your back. Place a __________ inch towel roll under your knee so that the knee slightly bends.  Raise only your lower leg by tightening the muscles in the front of your thigh. Do not allow your thigh to rise.  Hold this position for __________ seconds. Repeat __________ times. Complete this exercise __________ times per day.  OPTIONAL ANKLE WEIGHTS: Begin with ____________________, but DO NOT exceed ____________________. Increase in1 lb/0.5 kg increments.  STRENGTH - Quadriceps, Straight Leg Raises  Quality counts! Watch for signs that the quadriceps muscle is working to insure you are strengthening the correct muscles and not "cheating" by substituting with healthier muscles.  Lay on your back with your right / left leg extended and your opposite knee bent.  Tense the muscles in the front of your right / left thigh. You should see either your kneecap slide up or increased dimpling just above the knee. Your thigh may even quiver.  Tighten these muscles even more and raise your leg 4 to 6 inches off the floor. Hold for __________ seconds.  Keeping these muscles tense, lower your leg.  Relax the muscles slowly and completely in between each repetition. Repeat __________ times. Complete this exercise __________ times per day.  STRENGTH - Quadriceps, Step-Ups   Use a thick book, step or step stool that is __________ inches tall.  Holding a wall or counter for balance only, not support.  Slowly step-up with your right / left foot, keeping your knee in line with your hip and foot. Do not allow your knee to bend so far that you cannot see your toes.  Slowly unlock your knee and lower yourself to the starting position. Your muscles, not gravity,  should lower you. Repeat __________ times. Complete this exercise __________ times per day. Document Released: 12/05/2005 Document Revised: 04/21/2014 Document Reviewed: 03/19/2009 [facility] Patient Information 2015 [place], [place]. This information is not intended to replace advice given to you by your health care provider. Make sure you discuss any questions you have with your health care provider.

## 2014-08-06 NOTE — Progress Notes (Addendum)
This chart was scribed for Meredith Staggers, MD by Luisa Dago, ED Scribe. This patient was seen in room 12 and the patient's care was started at 8:30 PM.  Subjective:    Patient ID: Damon Butler, male    DOB: [birth year], 53 y.o.   MRN: [id number]  Chief Complaint  Patient presents with  . Knee Pain    with edema needs draining, left  x july 4th    HPI Damon Butler is a 53 y.o. Male  History: this is regular pt of mine. 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 Followed by Dr. Surg . Now presents with swelling in contralateral knee.   Today, pt presents to the office complaining of left knee pain and associated left knee swelling that started on July 4th. He does not recall and recent injuries or falls. Pt states that he hasn't been able to return to work. He says that it feels like there is "fluid" in the knee. Pt states that following the surgery he noticed a discrepancy in the length of his legs, which has caused him to put most of his weight on his left leg. No fever, chills, nausea, emesis, diaphoresis, palpitations, myalgias, SOB, chest pain, or weakness.   Patient Active Problem List   Diagnosis Date Noted  . OA (osteoarthritis) of knee 03/24/2014  . Other and unspecified hyperlipidemia 08/12/2013  . Acne 08/12/2013  . HTN (hypertension) 11/12/2012   Past Medical History  Diagnosis Date  . Allergy   . Heart murmur   . Anxiety   . Hypertension   . Sinus problem   . Arthritis     OA AND PAIN RT KNEE   Past Surgical History  Procedure Laterality Date  . Keloid surgery      BACK OF NECK  1986 & 1988  . Fracture surgery  1982    RT KNEE  . 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 Lisinopril    Prior to Admission medications   Medication Sig  Start Date End Date Taking? Authorizing Provider  aspirin 81 MG tablet Take 81 mg by mouth daily.   Yes Historical Provider, MD  Fish Oil-Cholecalciferol (FISH OIL + D3 PO) Take by mouth.   Yes Historical Provider, MD  hydrochlorothiazide (MICROZIDE) 12.5 MG capsule Take 1 capsule (12.5 mg total) by mouth daily. 07/02/14  Yes Shade Flood, MD  methocarbamol (ROBAXIN) 500 MG tablet Take 1 tablet (500 mg total) by mouth every 6 (six) hours as needed for muscle spasms. 03/25/14  Yes Amber Tamala Ser, PA-C  NIFEdipine (PROCARDIA XL/ADALAT-CC) 60 MG 24 hr tablet Take 1 tablet (60 mg total) by mouth daily with lunch. 07/02/14  Yes Shade Flood, MD   History   Social History  . Marital Status: Single    Spouse Name: N/A    Number of Children: N/A  . Years of Education: college   Occupational History  . auto Curator    Social History Main Topics  . Smoking status: Never Smoker   . Smokeless tobacco: Not on file  . Alcohol Use: 1.2 oz/week    2 Glasses of wine per week     Comment: all drinks-once a week 2 drinks  . Drug Use: No  . Sexual Activity: Yes  Birth Control/ Protection: Condom     Comment: sex partners in the last 12 months-2   Other Topics Concern  . Not on file   Social History Narrative  . No narrative on file     Review of Systems  Constitutional: Negative for fever and chills.  HENT: Negative for congestion.   Respiratory: Negative for cough and shortness of breath.   Cardiovascular: Negative for chest pain, palpitations and leg swelling.  Gastrointestinal: Negative for nausea and vomiting.  Musculoskeletal: Positive for arthralgias and joint swelling. Negative for myalgias.  Neurological: Negative for weakness, light-headedness and numbness.       Objective:   Physical Exam  Vitals reviewed. Constitutional: He is oriented to person, place, and time. He appears well-developed and well-nourished.  HENT:  Head: Normocephalic and atraumatic.    Eyes: EOM are normal. Pupils are equal, round, and reactive to light.  Neck: No JVD present. Carotid bruit is not present.  Cardiovascular: Normal rate, regular rhythm and normal heart sounds.   No murmur heard. Pulmonary/Chest: Effort normal and breath sounds normal. He has no rales.  Musculoskeletal: He exhibits no edema.  Left knee marked swelling of the prepatellar bursa without skin erythema or discharge. Minimal warmth. He does have some slight dependent edema into the left ankle without pitting. Full flexion. Full extension. No knee effusion. Negative varus and valgus stress test. Negative Lachman's. Negative Mcmurray.  Neurological: He is alert and oriented to person, place, and time.  Skin: Skin is warm and dry.  Psychiatric: He has a normal mood and affect.       Filed Vitals:   08/06/14 2008  BP: 120/80  Pulse: 92  Temp: 98.2 F (36.8 C)  TempSrc: Oral  Resp: 16  Height: 5\' 9"  (1.753 m)  Weight: 179 lb 12.8 oz (81.557 kg)  SpO2: 97%  Risks (including but not limited to bleeding and infection), benefits, and alternatives discussed for prepatellar bursa aspiration. Verbal consent obtained after any questions were answered. Landmarks noted, and marked as needed. Area cleansed with Betadine x3, ethyl chloride spray for topical anesthesia, followed by alcohol swab.  Aspirated with 22ga needle -  with hemostat exchange needle for 2nd 20cc syringe. 25 cc clear- yellow fluid obtained, No complications. Bandage and ACE bandage applied.  RTC precautions discussed in regards to injection and s/sx's of infection understanding expressed.    Assessment & Plan:   Damon Butler is a 53 y.o. male Prepatellar bursitis, left  Swelling of knee joint, left  Possible overuse or change in use of L leg since R knee replacement - may have some leg length discrepancy by his report. Aspirated 25 cc fluid with improvement/flattening of bursa.  Ace bandage applied and aftercare discussed.  rtc precautions and h/o given.   No orders of the defined types were placed in this encounter.   Patient Instructions  Ace bandage for pressure to area next week or two. If any redness, discharge, increased swelling or other signs of infection - return for recheck.   Prepatellar Bursitis with Rehab  Bursitis is a condition that is characterized by inflammation of a bursa. Kateri Mc exists in many areas of the body. They are fluid-filled sacs that lie between a soft tissue (skin, tendon, or ligament) and a bone, and they reduce friction between the structures as well as the stress placed on the soft tissue. Prepatellar bursitis is inflammation of the bursa that lies between the skin and the kneecap (patella). This condition often causes pain  over the patella. SYMPTOMS   Pain, tenderness, and/or inflammation over the patella.  Pain that worsens with movement of the knee joint.  Decreased range of motion for the knee joint.  A crackling sound (crepitation) when the bursa is moved or touched.  Occasionally, painless swelling of the bursa.  Fever (when infected). CAUSES  Bursitis is caused by damage to the bursa, which results in an inflammatory response. Common mechanisms of injury include:  Direct trauma to the front of the knee.  Repetitive and/or stressful use of the knee. RISK INCREASES WITH:  Activities in which kneeling and/or falling on one's knees is likely (volleyball or football).  Repetitive and stressful training, especially if it involves running on hills.  Improper training techniques, such as a sudden increase in the intensity, frequency, or duration of training.  Failure to warm up properly before activity.  Poor technique.  Artificial turf. PREVENTION   Avoid kneeling or falling on your knees.  Warm up and stretch properly before activity.  Allow for adequate recovery between workouts.  Maintain physical fitness:  Strength, flexibility, and  endurance.  Cardiovascular fitness.  Learn and use proper technique. When possible, have a coach correct improper technique.  Wear properly fitted and padded protective equipment (kneepads). PROGNOSIS  If treated properly, then the symptoms of prepatellar bursitis usually resolve within 2 weeks. RELATED COMPLICATIONS   Recurrent symptoms that result in a chronic problem.  Prolonged healing time, if improperly treated or reinjured.  Limited range of motion.  Infection of bursa.  Chronic inflammation or scarring of bursa. TREATMENT  Treatment initially involves the use of ice and medication to help reduce pain and inflammation. The use of strengthening and stretching exercises may help reduce pain with activity, especially those of the quadriceps and hamstring muscles. These exercises may be performed at home or with referral to a therapist. Your caregiver may recommend kneepads when you return to playing sports, in order to reduce the stress on the prepatellar bursa. If symptoms persist despite treatment, then your caregiver may drain fluid out with a needle (aspirate) the bursa. If symptoms persist for greater than 6 months despite nonsurgical (conservative) treatment, then surgery may be recommended to remove the bursa.  MEDICATION  If pain medication is necessary, then nonsteroidal anti-inflammatory medications, such as aspirin and ibuprofen, or other minor pain relievers, such as acetaminophen, are often recommended.  Do not take pain medication for 7 days before surgery.  Prescription pain relievers may be given if deemed necessary by your caregiver. Use only as directed and only as much as you need.  Corticosteroid injections may be given by your caregiver. These injections should be reserved for the most serious cases, because they may only be given a certain number of times. HEAT AND COLD  Cold treatment (icing) relieves pain and reduces inflammation. Cold treatment should be  applied for 10 to 15 minutes every 2 to 3 hours for inflammation and pain and immediately after any activity that aggravates your symptoms. Use ice packs or massage the area with a piece of ice (ice massage).  Heat treatment may be used prior to performing the stretching and strengthening activities prescribed by your caregiver, physical therapist, or athletic trainer. Use a heat pack or soak the injury in warm water. SEEK MEDICAL CARE IF:  Treatment seems to offer no benefit, or the condition worsens.  Any medications produce adverse side effects. EXERCISES RANGE OF MOTION (ROM) AND STRETCHING EXERCISES - Prepatellar Bursitis These exercises may help you when  beginning to rehabilitate your injury. Your symptoms may resolve with or without further involvement from your physician, physical therapist or athletic trainer. While completing these exercises, remember:   Restoring tissue flexibility helps normal motion to return to the joints. This allows healthier, less painful movement and activity.  An effective stretch should be held for at least 30 seconds.  A stretch should never be painful. You should only feel a gentle lengthening or release in the stretched tissue. STRETCH - Hamstrings, Standing  Stand or sit and extend your right / left leg, placing your foot on a chair or foot stool  Keeping a slight arch in your low back and your hips straight forward.  Lead with your chest and lean forward at the waist until you feel a gentle stretch in the back of your right / left knee or thigh. (When done correctly, this exercise requires leaning only a small distance.)  Hold this position for __________ seconds. Repeat __________ times. Complete this stretch __________ times per day. STRETCH - Quadriceps, Prone   Lie on your stomach on a firm surface, such as a bed or padded floor.  Bend your right / left knee and grasp your ankle. If you are unable to reach, your ankle or pant leg, use a  belt around your foot to lengthen your reach.  Gently pull your heel toward your buttocks. Your knee should not slide out to the side. You should feel a stretch in the front of your thigh and/or knee.  Hold this position for __________ seconds. Repeat __________ times. Complete this stretch __________ times per day.  STRETCH - Hamstrings/Adductors, V-Sit   Sit on the floor with your legs extended in a large "V," keeping your knees straight.  With your head and chest upright, bend at your waist reaching for your right foot to stretch your left adductors.  You should feel a stretch in your left inner thigh. Hold for __________ seconds.  Return to the upright position to relax your leg muscles.  Continuing to keep your chest upright, bend straight forward at your waist to stretch your hamstrings.  You should feel a stretch behind both of your thighs and/or knees. Hold for __________ seconds.  Return to the upright position to relax your leg muscles.  Repeat steps 2 through 4. Repeat __________ times. Complete this exercise __________ times per day.  STRENGTHENING EXERCISES - Prepatellar Bursitis  These exercises may help you when beginning to rehabilitate your injury. They may resolve your symptoms with or without further involvement from your physician, physical therapist or athletic trainer. While completing these exercises, remember:  Muscles can gain both the endurance and the strength needed for everyday activities through controlled exercises.  Complete these exercises as instructed by your physician, physical therapist or athletic trainer. Progress the resistance and repetitions only as guided. STRENGTH - Quadriceps, Isometrics  Lie on your back with your right / left leg extended and your opposite knee bent.  Gradually tense the muscles in the front of your right / left thigh. You should see either your kneecap slide up toward your hip or increased dimpling just above the  knee. This motion will push the back of the knee down toward the floor/mat/bed on which you are lying.  Hold the muscle as tight as you can without increasing your pain for __________ seconds.  Relax the muscles slowly and completely in between each repetition. Repeat __________ times. Complete this exercise __________ times per day.  STRENGTH - Quadriceps, Short  Arcs   Lie on your back. Place a __________ inch towel roll under your knee so that the knee slightly bends.  Raise only your lower leg by tightening the muscles in the front of your thigh. Do not allow your thigh to rise.  Hold this position for __________ seconds. Repeat __________ times. Complete this exercise __________ times per day.  OPTIONAL ANKLE WEIGHTS: Begin with ____________________, but DO NOT exceed ____________________. Increase in1 lb/0.5 kg increments.  STRENGTH - Quadriceps, Straight Leg Raises  Quality counts! Watch for signs that the quadriceps muscle is working to insure you are strengthening the correct muscles and not "cheating" by substituting with healthier muscles.  Lay on your back with your right / left leg extended and your opposite knee bent.  Tense the muscles in the front of your right / left thigh. You should see either your kneecap slide up or increased dimpling just above the knee. Your thigh may even quiver.  Tighten these muscles even more and raise your leg 4 to 6 inches off the floor. Hold for __________ seconds.  Keeping these muscles tense, lower your leg.  Relax the muscles slowly and completely in between each repetition. Repeat __________ times. Complete this exercise __________ times per day.  STRENGTH - Quadriceps, Step-Ups   Use a thick book, step or step stool that is __________ inches tall.  Holding a wall or counter for balance only, not support.  Slowly step-up with your right / left foot, keeping your knee in line with your hip and foot. Do not allow your knee to bend so  far that you cannot see your toes.  Slowly unlock your knee and lower yourself to the starting position. Your muscles, not gravity, should lower you. Repeat __________ times. Complete this exercise __________ times per day. Document Released: 12/05/2005 Document Revised: 04/21/2014 Document Reviewed: 03/19/2009 [facility] Patient Information 2015 [employer], [place]. This information is not intended to replace advice given to you by your health care provider. Make sure you discuss any questions you have with your health care provider.      I personally performed the services described in this documentation, which was scribed in my presence. The recorded information has been reviewed and is accurate.

## 2014-08-07 ENCOUNTER — Telehealth: Payer: Self-pay

## 2014-08-07 NOTE — Telephone Encounter (Signed)
Pt would like to know if he has to keep the ace bandage on his knee everyday for a week or can he take it off sometimes.  Best#

## 2014-08-11 ENCOUNTER — Encounter: Payer: Self-pay | Admitting: Family Medicine

## 2014-08-11 ENCOUNTER — Ambulatory Visit (INDEPENDENT_AMBULATORY_CARE_PROVIDER_SITE_OTHER): Payer: BC Managed Care – PPO | Admitting: Family Medicine

## 2014-08-11 VITALS — BP 136/86 | HR 84 | Temp 98.3°F | Resp 16 | Ht 70.5 in | Wt 178.5 lb

## 2014-08-11 DIAGNOSIS — M25476 Effusion, unspecified foot: Secondary | ICD-10-CM

## 2014-08-11 DIAGNOSIS — M7042 Prepatellar bursitis, left knee: Secondary | ICD-10-CM

## 2014-08-11 DIAGNOSIS — M7040 Prepatellar bursitis, unspecified knee: Secondary | ICD-10-CM

## 2014-08-11 DIAGNOSIS — M25473 Effusion, unspecified ankle: Secondary | ICD-10-CM

## 2014-08-11 DIAGNOSIS — I10 Essential (primary) hypertension: Secondary | ICD-10-CM

## 2014-08-11 LAB — LIPID PANEL
CHOL/HDL RATIO: 3.1 ratio
CHOLESTEROL: 164 mg/dL (ref 0–200)
HDL: 53 mg/dL (ref >39)
LDL CALC: 100 mg/dL — AB (ref 0–99)
TRIGLYCERIDES: 54 mg/dL (ref <150)
VLDL: 11 mg/dL (ref 0–40)

## 2014-08-11 LAB — COMPLETE METABOLIC PANEL WITH GFR
ALK PHOS: 67 U/L (ref 39–117)
ALT: 16 U/L (ref 0–53)
AST: 15 U/L (ref 0–37)
Albumin: 4.4 g/dL (ref 3.5–5.2)
BUN: 9 mg/dL (ref 6–23)
CHLORIDE: 100 meq/L (ref 96–112)
CO2: 27 mEq/L (ref 19–32)
CREATININE: 0.83 mg/dL (ref 0.50–1.35)
Calcium: 9.3 mg/dL (ref 8.4–10.5)
GFR, Est African American: >89 mL/min
GFR, Est Non African American: >89 mL/min
Glucose, Bld: 97 mg/dL (ref 70–99)
Potassium: 3.7 mEq/L (ref 3.5–5.3)
Sodium: 134 mEq/L — ABNORMAL LOW (ref 135–145)
Total Bilirubin: 0.5 mg/dL (ref 0.2–1.2)
Total Protein: 7.3 g/dL (ref 6.0–8.3)

## 2014-08-11 MED ORDER — NIFEDIPINE ER OSMOTIC RELEASE 60 MG PO TB24: 60.0000 mg | ORAL_TABLET | Freq: Every day | ORAL | Status: DC

## 2014-08-11 MED ORDER — HYDROCHLOROTHIAZIDE 12.5 MG PO CAPS: 12.5000 mg | ORAL_CAPSULE | Freq: Every day | ORAL | Status: DC

## 2014-08-11 NOTE — Patient Instructions (Addendum)
Keep follow up with your orthopaedist on Sept 3rd. Can discuss L prepatellar bursitis treatment then if still swollen or return after a few weeks of ace bandage to area. Avoid pressure to left knee and repetitive activity if possible.  Return to the clinic or go to the nearest emergency room if any of your symptoms worsen or new symptoms occur. If swelling in ankles is not improving as weather cools and knee swelling improves, could try to change blood pressure meds again.  You should receive a call or letter about your lab results within the next week to 10 days.    Plan on physical in next 3-6 months.

## 2014-08-11 NOTE — Progress Notes (Signed)
Subjective:    Patient ID: Damon Butler, male    DOB: February 16, 1961, 53 y.o.   MRN: [id number]  HPI Damon Butler is a 53 y.o. male Here for follow up.   HTN with ankle swelling when seen in July. suspected dependent edema, ankle edema, intermittent bilterral with knee swelling, but with prior surgery - doppler obtained - no DVT. Nifedipine thought to be contributory - decreased dose to  QD, added hctz 12.5mg  qd and here for recheck. Angioedema in past with Ace-I. Off this now. Fasting today. Home BP - 121-137/80's. Sometimes dry eyes or mouth. Uses otc eye drops. No recent lightheadedness.   L prepatellar bursitis - s/p aspiration few days ago - wearing ace wrap. Has had some recurrence of swelling, but not as much.  Some swelling in ankle with outside work in the heat. Improved with elevating legs, and less than when higher dose of nifedipine. No calf pain, chest pain or dyspnea.   Results for orders placed during the hospital encounter of 03/24/14  CBC      Result Value Ref Range   WBC 12.9 (*) 4.0 - 10.5 K/uL   RBC 3.91 (*) 4.22 - 5.81 MIL/uL   Hemoglobin 11.9 (*) 13.0 - 17.0 g/dL   HCT 40.9 (*) 81.1 - 91.4 %   MCV 87.2  78.0 - 100.0 fL   MCH 30.4  26.0 - 34.0 pg   MCHC 34.9  30.0 - 36.0 g/dL   RDW 78.2  95.6 - 21.3 %   Platelets 331  150 - 400 K/uL  BASIC METABOLIC PANEL      Result Value Ref Range   Sodium 134 (*) 137 - 147 mEq/L   Potassium 3.4 (*) 3.7 - 5.3 mEq/L   Chloride 97  96 - 112 mEq/L   CO2 23  19 - 32 mEq/L   Glucose, Bld 129 (*) 70 - 99 mg/dL   BUN 6  6 - 23 mg/dL   Creatinine, Ser 0.86  0.50 - 1.35 mg/dL   Calcium 8.7  8.4 - 57.8 mg/dL   GFR calc non Af Amer >90  >90 mL/min   GFR calc Af Amer >90  >90 mL/min  CBC      Result Value Ref Range   WBC 14.7 (*) 4.0 - 10.5 K/uL   RBC 3.78 (*) 4.22 - 5.81 MIL/uL   Hemoglobin 11.5 (*) 13.0 - 17.0 g/dL   HCT 46.9 (*) 62.9 - 52.8 %   MCV 87.6  78.0 - 100.0 fL   MCH 30.4  26.0 - 34.0 pg   MCHC 34.7   30.0 - 36.0 g/dL   RDW 41.3  24.4 - 01.0 %   Platelets 312  150 - 400 K/uL  BASIC METABOLIC PANEL      Result Value Ref Range   Sodium 135 (*) 137 - 147 mEq/L   Potassium 3.6 (*) 3.7 - 5.3 mEq/L   Chloride 95 (*) 96 - 112 mEq/L   CO2 26  19 - 32 mEq/L   Glucose, Bld 124 (*) 70 - 99 mg/dL   BUN 11  6 - 23 mg/dL   Creatinine, Ser 2.72  0.50 - 1.35 mg/dL   Calcium 9.0  8.4 - 53.6 mg/dL   GFR calc non Af Amer >90  >90 mL/min   GFR calc Af Amer >90  >90 mL/min  TYPE AND SCREEN      Result Value Ref Range   ABO/RH(D) O POS  Antibody Screen NEG     Sample Expiration 03/27/2014    ABO/RH      Result Value Ref Range   ABO/RH(D) O POS       Patient Active Problem List   Diagnosis Date Noted  . OA (osteoarthritis) of knee 03/24/2014  . Other and unspecified hyperlipidemia 08/12/2013  . Acne 08/12/2013  . HTN (hypertension) 11/12/2012   Past Medical History  Diagnosis Date  . Allergy   . Heart murmur   . Anxiety   . Hypertension   . Sinus problem   . Arthritis     OA AND PAIN RT KNEE   Past Surgical History  Procedure Laterality Date  . Keloid surgery      BACK OF NECK  1986 & 1988  . Fracture surgery  1982    RT KNEE  . 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 Lisinopril Swelling    LIPS    Prior to Admission medications   Medication Sig Start Date End Date Taking? Authorizing Provider  aspirin 81 MG tablet Take 81 mg by mouth daily.   Yes Historical Provider, MD  Fish Oil-Cholecalciferol (FISH OIL + D3 PO) Take by mouth.   Yes Historical Provider, MD  hydrochlorothiazide (MICROZIDE) 12.5 MG capsule Take 1 capsule (12.5 mg total) by mouth daily. 07/02/14  Yes Shade Flood, MD  methocarbamol (ROBAXIN) 500 MG tablet Take 1 tablet (500 mg total) by mouth every 6 (six)  hours as needed for muscle spasms. 03/25/14  Yes Amber Tamala Ser, PA-C  naproxen sodium (ANAPROX) 220 MG tablet Take 220 mg by mouth as needed.   Yes Historical Provider, MD  NIFEdipine (PROCARDIA XL/ADALAT-CC) 60 MG 24 hr tablet Take 1 tablet (60 mg total) by mouth daily with lunch. 07/02/14  Yes Shade Flood, MD   History   Social History  . Marital Status: Single    Spouse Name: N/A    Number of Children: N/A  . Years of Education: college   Occupational History  . auto Curator    Social History Main Topics  . Smoking status: Never Smoker   . Smokeless tobacco: Not on file  . Alcohol Use: 1.2 oz/week    2 Glasses of wine per week     Comment: all drinks-once a week 2 drinks  . Drug Use: No  . Sexual Activity: Yes    Birth Control/ Protection: Condom     Comment: sex partners in the last 12 months-2   Other Topics Concern  . Not on file   Social History Narrative  . No narrative on file     Review of Systems  Constitutional: Negative for unexpected weight change. Fatigue: occasional.  Eyes: Negative for visual disturbance.  Respiratory: Negative for cough, chest tightness and shortness of breath.   Cardiovascular: Positive for leg swelling. Negative for chest pain and palpitations.  Gastrointestinal: Negative for abdominal pain and blood in stool.  Neurological: Negative for dizziness, light-headedness and headaches.       Objective:   Physical Exam  Vitals reviewed. Constitutional: He is oriented to person, place, and time. He appears well-developed and well-nourished.  HENT:  Head: Normocephalic and atraumatic.  Eyes: EOM are normal. Pupils are equal, round, and reactive to light.  Neck: No JVD present. Carotid bruit is  not present.  Cardiovascular: Normal rate, regular rhythm and normal heart sounds.   No murmur heard. Pulmonary/Chest: Effort normal and breath sounds normal. He has no rales.  Musculoskeletal: He exhibits no edema.       Left knee:  He exhibits swelling (1+ effusion in prepatellar bursa on left, but fluctuant, not tight, no redness. ). He exhibits no ecchymosis, no deformity, no erythema and no bony tenderness. No tenderness found. No medial joint line and no lateral joint line tenderness noted.  Neurological: He is alert and oriented to person, place, and time.  Skin: Skin is warm and dry.  Psychiatric: He has a normal mood and affect.   Filed Vitals:   08/11/14 1459  BP: 136/86  Pulse: 84  Temp: 98.3 F (36.8 C)  TempSrc: Oral  Resp: 16  Height: 5' 10.5" (1.791 m)  Weight: 178 lb 8 oz (80.967 kg)  SpO2: 96%       Assessment & Plan:   Damon Butler is a 53 y.o. male Essential hypertension - Plan: COMPLETE METABOLIC PANEL WITH GFR, Lipid panel, hydrochlorothiazide (MICROZIDE) 12.5 MG capsule, NIFEdipine (PROCARDIA XL/ADALAT-CC) 60 MG 24 hr tablet  -overall BP controlled. Option of d/c difedipine with LE edema and incr hctz, but he would like to wait to see if improves with swelling in knees improves and with less hot weather. Can call to discuss option of change if needed.   Prepatellar bursitis, left  -some recurrence, but less than prior. Cont compresion with ACE bandage and avoid repetitive activity or pressure to this area.   Ankle swelling, unspecified laterality  -as above. If not improving after knee swelling improves, consider change off dihydropyridine and higher dose HCTZ. rtc precautions.   HM- declined flu vaccine. Planning on CPE in next 3-6 months.   Meds ordered this encounter  Medications  . naproxen sodium (ANAPROX) 220 MG tablet    Sig: Take 220 mg by mouth as needed.  . hydrochlorothiazide (MICROZIDE) 12.5 MG capsule    Sig: Take 1 capsule (12.5 mg total) by mouth daily.    Dispense:  90 capsule    Refill:  1  . NIFEdipine (PROCARDIA XL/ADALAT-CC) 60 MG 24 hr tablet    Sig: Take 1 tablet (60 mg total) by mouth daily with lunch.    Dispense:  90 tablet    Refill:  1    Patient Instructions  Keep follow up with your orthopaedist on Sept 3rd. Can discuss L prepatellar bursitis treatment then if still swollen or return after a few weeks of ace bandage to area. Avoid pressure to left knee and repetitive activity if possible.  Return to the clinic or go to the nearest emergency room if any of your symptoms worsen or new symptoms occur. If swelling in ankles is not improving as weather cools and knee swelling improves, could try to change blood pressure meds again.  You should receive a call or letter about your lab results within the next week to 10 days.    Plan on physical in next 3-6 months.

## 2014-10-20 ENCOUNTER — Encounter: Payer: Self-pay | Admitting: Family Medicine

## 2014-10-20 ENCOUNTER — Ambulatory Visit (INDEPENDENT_AMBULATORY_CARE_PROVIDER_SITE_OTHER): Payer: BC Managed Care – PPO | Admitting: Family Medicine

## 2014-10-20 VITALS — BP 140/96 | HR 98 | Temp 98.6°F | Resp 16 | Ht 70.5 in | Wt 178.0 lb

## 2014-10-20 DIAGNOSIS — H538 Other visual disturbances: Secondary | ICD-10-CM

## 2014-10-20 DIAGNOSIS — R0981 Nasal congestion: Secondary | ICD-10-CM

## 2014-10-20 DIAGNOSIS — I10 Essential (primary) hypertension: Secondary | ICD-10-CM

## 2014-10-20 DIAGNOSIS — R42 Dizziness and giddiness: Secondary | ICD-10-CM

## 2014-10-20 DIAGNOSIS — Z125 Encounter for screening for malignant neoplasm of prostate: Secondary | ICD-10-CM

## 2014-10-20 DIAGNOSIS — D649 Anemia, unspecified: Secondary | ICD-10-CM

## 2014-10-20 MED ORDER — HYDROCHLOROTHIAZIDE 12.5 MG PO CAPS: 12.5000 mg | ORAL_CAPSULE | Freq: Every day | ORAL | Status: DC

## 2014-10-20 MED ORDER — DESLORATADINE 5 MG PO TABS: 5.0000 mg | ORAL_TABLET | Freq: Every day | ORAL | Status: DC

## 2014-10-20 MED ORDER — NIFEDIPINE ER OSMOTIC RELEASE 60 MG PO TB24: 60.0000 mg | ORAL_TABLET | Freq: Every day | ORAL | Status: DC

## 2014-10-20 MED ORDER — FLUTICASONE PROPIONATE 50 MCG/ACT NA SUSP: 2.0000 | Freq: Every day | NASAL | Status: DC

## 2014-10-20 NOTE — Progress Notes (Signed)
Subjective:    Patient ID: Damon Butler, male    DOB: December 25, 1960, 53 y.o.   MRN: [id number]  Damon FloodGREENE,Damon R, MD  Chief Complaint  Patient presents with  . Annual Exam  . Medication Refill    HCTZ 12.5 mg, Nifedipine 60 mg   Patient Active Problem List   Diagnosis Date Noted  . OA (osteoarthritis) of knee 03/24/2014  . Other and unspecified hyperlipidemia 08/12/2013  . Acne 08/12/2013  . HTN (hypertension) 11/12/2012   Prior to Admission medications   Medication Sig Start Date End Date Taking? Authorizing Provider  aspirin 81 MG tablet Take 81 mg by mouth daily.   Yes Historical Provider, MD  Fish Oil-Cholecalciferol (FISH OIL + D3 PO) Take by mouth.   Yes Historical Provider, MD  hydrochlorothiazide (MICROZIDE) 12.5 MG capsule Take 1 capsule (12.5 mg total) by mouth daily. 08/11/14  Yes Damon FloodJeffrey Butler Greene, MD  methocarbamol (ROBAXIN) 500 MG tablet Take 1 tablet (500 mg total) by mouth every 6 (six) hours as needed for muscle spasms. 03/25/14  Yes Amber Tamala SerLauren Constable, PA-C  NIFEdipine (PROCARDIA XL/ADALAT-CC) 60 MG 24 hr tablet Take 1 tablet (60 mg total) by mouth daily with lunch. 08/11/14  Yes Damon FloodJeffrey Butler Greene, MD  naproxen sodium (ANAPROX) 220 MG tablet Take 220 mg by mouth as needed.    Historical Provider, MD   Medications, allergies, past medical history, surgical history, family history, social history and problem list reviewed and updated.  HPI  [age] yom with PMH HTN, acne, and OA right knee presents for CPE.  He has been doing well since last visit here. Sinuses have been bothering him. Took benadryl for few days which helped. Itchy eyes at night. Allergies when he was younger but only occas now. Both fall and spring.   He mentions he has been having blurry vision in both eyes for the past few weeks. It comes on suddenly and lasts btwn 20-30 min. Resolves on its own. He sometimes gets a "drunk" feeling when his vision does this, but denies actual dizziness. Denies  HA, hearing changes, palpitations, or CP assoc with vision changes. He last saw optometry one year ago.   HTN - Checking BP at home - once week. 130s/80-90s. Was having bilateral LE edema at last visit. We decreased procardia and added hctz at that time. He feels edema has improved a great deal since then.    He is scheduled to have 2 knee surgeries on 11/25/14. Scar tissue removal right knee and bursa repair left knee. Hasn't been able to exercise lately due to the knees but planning to start again after surgeries.   Up to date with colonoscopy and TDap, declines flu vaccine.    Review of Systems No CP, SOB, abd pain, N/V, diarrhea, fever, chills.     Objective:   Physical Exam  Constitutional: He is oriented to person, place, and time. He appears well-developed and well-nourished.  Non-toxic appearance. He does not have a sickly appearance. He does not appear ill. No distress.  BP 140/96 mmHg  Pulse 98  Temp(Src) 98.6 F (37 C) (Oral)  Resp 16  Ht 5' 10.5" (1.791 m)  Wt 178 lb (80.74 kg)  BMI 25.17 kg/m2  SpO2 98%   HENT:  Head: Normocephalic and atraumatic.  Right Ear: Hearing, tympanic membrane, external ear and ear canal normal.  Left Ear: Hearing, tympanic membrane, external ear and ear canal normal.  Nose: Nose normal. No mucosal edema or rhinorrhea.  Mouth/Throat: Oropharynx  is clear and moist. No oropharyngeal exudate, posterior oropharyngeal edema or posterior oropharyngeal erythema.  Eyes: Conjunctivae and EOM are normal. Pupils are equal, round, and reactive to light.  Fundoscopic exam:      The right eye shows red reflex.       The left eye shows red reflex.  No nystagmus.   Cardiovascular: Normal rate, regular rhythm, S1 normal, S2 normal and normal heart sounds.  Exam reveals no gallop.   No murmur heard. Pulses:      Dorsalis pedis pulses are 2+ on the right side, and 2+ on the left side.       Posterior tibial pulses are 2+ on the right side, and 2+ on the left  side.  No carotid bruit bilaterally  Pulmonary/Chest: Breath sounds normal. He has no decreased breath sounds. He has no wheezes. He has no rhonchi. He has no rales.  Neurological: He is alert and oriented to person, place, and time. He has normal strength. No cranial nerve deficit or sensory deficit. He displays a negative Romberg sign. Coordination and gait normal.  Neg pronator drift. Normal heel-to-toe.   Psychiatric: He has a normal mood and affect. His speech is normal.      Assessment & Plan:   [age] yom with PMH HTN, acne, and OA right knee presents for CPE.  Dizziness and giddiness - Plan: Basic metabolic panel --Carotids without bruit --BMP for lytes --Further w/u if optho does not find anything as sx are assoc with vision changes  Low hemoglobin - Plan: CBC  Prostate cancer screening - Plan: PSA  Blurry vision, bilateral - Plan: Ambulatory referral to Ophthalmology --If optho does not find anything will consider referral to neuro if sx persist  Head congestion - Plan: desloratadine (CLARINEX) 5 MG tablet, fluticasone (FLONASE) 50 MCG/ACT nasal spray --Likely due to allergies --clarinex qd for 2 weeks --flonase bid for 2 weeks  Essential hypertension - Plan: hydrochlorothiazide (MICROZIDE) 12.5 MG capsule, NIFEdipine (PROCARDIA XL/ADALAT-CC) 60 MG 24 hr tablet --Well controlled on current regimen, continue  Donnajean Lopesodd M. Laydon Martis, PA-C Physician Assistant-Certified [facility]  10/20/2014 5:12 PM

## 2014-10-20 NOTE — Patient Instructions (Signed)
Your head congestion and itchy eyes are most likely due to allergies. Please take the clarinex and flonase as prescribed. We will contact you with the results of the labs we drew today. [facility] to the blurry vision/wooziness you mentioned. They will contact you to set up an appt.  If everything looks good from them, we may then refer you on to neurology. If your vision or wooziness doesn't improve in 2-3 weeks, please come back to clinic. If it worsens, please come here or go to the ER asap.

## 2014-10-21 ENCOUNTER — Encounter: Payer: Self-pay | Admitting: Family Medicine

## 2014-10-21 LAB — BASIC METABOLIC PANEL
BUN: 7 mg/dL (ref 6–23)
CO2: 28 mEq/L (ref 19–32)
Calcium: 9.5 mg/dL (ref 8.4–10.5)
Chloride: 99 mEq/L (ref 96–112)
Creat: 0.88 mg/dL (ref 0.50–1.35)
Glucose, Bld: 106 mg/dL — ABNORMAL HIGH (ref 70–99)
POTASSIUM: 3.8 meq/L (ref 3.5–5.3)
SODIUM: 136 meq/L (ref 135–145)

## 2014-10-21 LAB — CBC
HEMATOCRIT: 43.2 % (ref 39.0–52.0)
Hemoglobin: 15.2 g/dL (ref 13.0–17.0)
MCH: 29.5 pg (ref 26.0–34.0)
MCHC: 35.2 g/dL (ref 30.0–36.0)
MCV: 83.7 fL (ref 78.0–100.0)
PLATELETS: 431 10*3/uL — AB (ref 150–400)
RBC: 5.16 MIL/uL (ref 4.22–5.81)
RDW: 14.6 % (ref 11.5–15.5)
WBC: 6.1 10*3/uL (ref 4.0–10.5)

## 2014-10-21 LAB — PSA: PSA: 0.58 ng/mL (ref <=4.00)

## 2014-10-21 NOTE — Progress Notes (Signed)
Patient discussed and examined with Damon Butler. Agree with assessment and plan of care per his note.   

## 2014-11-10 NOTE — Progress Notes (Signed)
Please put orders in Epic surgery 11-25-14 pre op 11-11-14 Thanks

## 2014-11-10 NOTE — Patient Instructions (Addendum)
Damon Butler  11/10/2014                           YOUR PROCEDURE IS SCHEDULED ON:  11/25/14                ENTER FROM [address] - GO TO PARKING DECK               LOOK FOR [employer]  / [employer]                              FOLLOW  SIGNS TO SHORT STAY CENTER                 ARRIVE AT SHORT STAY AT:  11:00 AM               CALL THIS NUMBER IF ANY PROBLEMS THE DAY OF SURGERY :               [phone]                                REMEMBER:   Do not eat food or drink liquids AFTER MIDNIGHT                  Take these medicines the morning of surgery with               A SIPS OF WATER :     MAY USE FLONASE NASAL SPRAY IF NEEDED    Do not wear jewelry, make-up   Do not wear lotions, powders, or perfumes.   Do not shave legs or underarms 12 hrs. before surgery (men may shave face)  Do not bring valuables to the hospital.  Contacts, dentures or bridgework may not be worn into surgery.  Leave suitcase in the car. After surgery it may be brought to your room.  For patients admitted to the hospital more than one night, checkout time is            11:00 AM                                                       The day of discharge.   Patients discharged the day of surgery will not be allowed to drive home.            If going home same day of surgery, must have someone stay with you              FIRST 24 hrs at home and arrange for some one to drive you              home from hospital.   ________________________________________________________________________  [facility] - PREPARING FOR SURGERY  Before surgery, you can play an important role.  Because skin is not sterile, your skin needs to be as free of germs as possible.  You can reduce the number of germs on your skin by washing with CHG (chlorahexidine gluconate) soap before surgery.  CHG is an  antiseptic cleaner which kills germs and bonds with the skin to continue killing germs even after washing. Please DO NOT use if you have an allergy to CHG or antibacterial soaps.  If your skin becomes reddened/irritated stop using the CHG and inform your nurse when you arrive at Short Stay. Do not shave (including legs and underarms) for at least 48 hours prior to the first CHG shower.  You may shave your face. Please follow these instructions carefully:   1.  Shower with CHG Soap the night before surgery and the  morning of Surgery.   2.  If you choose to wash your hair, wash your hair first as usual with your  normal  Shampoo.   3.  After you shampoo, rinse your hair and body thoroughly to remove the  shampoo.                                         4.  Use CHG as you would any other liquid soap.  You can apply chg directly  to the skin and wash . Gently wash with scrungie or clean wascloth    5.  Apply the CHG Soap to your body ONLY FROM THE NECK DOWN.   Do not use on open                           Wound or open sores. Avoid contact with eyes, ears mouth and genitals (private parts).                        Genitals (private parts) with your normal soap.              6.  Wash thoroughly, paying special attention to the area where your surgery  will be performed.   7.  Thoroughly rinse your body with warm water from the neck down.   8.  DO NOT shower/wash with your normal soap after using and rinsing off  the CHG Soap .                9.  Pat yourself dry with a clean towel.             10.  Wear clean pajamas.             11.  Place clean sheets on your bed the night of your first shower and do not  sleep with pets.  Day of Surgery : Do not apply any lotions/deodorants the morning of surgery.  Please wear clean clothes to the hospital/surgery center.  FAILURE TO FOLLOW THESE INSTRUCTIONS MAY RESULT IN THE CANCELLATION OF YOUR SURGERY    PATIENT  SIGNATURE_________________________________  ______________________________________________________________________  Damon MireIncentive Spirometer  An incentive spirometer is a tool that can help keep your lungs clear and active. This tool measures how well you are filling your lungs with each breath. Taking long deep breaths may help reverse or decrease the chance of developing breathing (pulmonary) problems (especially infection) following:  A long  period of time when you are unable to move or be active. BEFORE THE PROCEDURE   If the spirometer includes an indicator to show your best effort, your nurse or respiratory therapist will set it to a desired goal.  If possible, sit up straight or lean slightly forward. Try not to slouch.  Hold the incentive spirometer in an upright position. INSTRUCTIONS FOR USE  1. Sit on the edge of your bed if possible, or sit up as far as you can in bed or on a chair. 2. Hold the incentive spirometer in an upright position. 3. Breathe out normally. 4. Place the mouthpiece in your mouth and seal your lips tightly around it. 5. Breathe in slowly and as deeply as possible, raising the piston or the ball toward the top of the column. 6. Hold your breath for 3-5 seconds or for as long as possible. Allow the piston or ball to fall to the bottom of the column. 7. Remove the mouthpiece from your mouth and breathe out normally. 8. Rest for a few seconds and repeat Steps 1 through 7 at least 10 times every 1-2 hours when you are awake. Take your time and take a few normal breaths between deep breaths. 9. The spirometer may include an indicator to show your best effort. Use the indicator as a goal to work toward during each repetition. 10. After each set of 10 deep breaths, practice coughing to be sure your lungs are clear. If you have an incision (the cut made at the time of surgery), support your incision when coughing by placing a pillow or rolled up towels firmly against  it. Once you are able to get out of bed, walk around indoors and cough well. You may stop using the incentive spirometer when instructed by your caregiver.  RISKS AND COMPLICATIONS  Take your time so you do not get dizzy or light-headed.  If you are in pain, you may need to take or ask for pain medication before doing incentive spirometry. It is harder to take a deep breath if you are having pain. AFTER USE  Rest and breathe slowly and easily.  It can be helpful to keep track of a log of your progress. Your caregiver can provide you with a simple table to help with this. If you are using the spirometer at home, follow these instructions: SEEK MEDICAL CARE IF:   You are having difficultly using the spirometer.  You have trouble using the spirometer as often as instructed.  Your pain medication is not giving enough relief while using the spirometer.  You develop fever of 100.5 F (38.1 C) or higher. SEEK IMMEDIATE MEDICAL CARE IF:   You cough up bloody sputum that had not been present before.  You develop fever of 102 F (38.9 C) or greater.  You develop worsening pain at or near the incision site. MAKE SURE YOU:   Understand these instructions.  Will watch your condition.  Will get help right away if you are not doing well or get worse. Document Released: 04/17/2007 Document Revised: 02/27/2012 Document Reviewed: 06/18/2007 [facility] Patient Information 2014 [employer], [place].   ________________________________________________________________________

## 2014-11-11 ENCOUNTER — Encounter (HOSPITAL_COMMUNITY)
Admission: RE | Admit: 2014-11-11 | Discharge: 2014-11-11 | Disposition: A | Discharge status: Home / Self Care | Payer: BC Managed Care – PPO | Source: Ambulatory Visit | Attending: Orthopedic Surgery | Admitting: Orthopedic Surgery

## 2014-11-11 ENCOUNTER — Encounter (HOSPITAL_COMMUNITY): Payer: Self-pay

## 2014-11-11 ENCOUNTER — Ambulatory Visit: Payer: Self-pay | Admitting: Orthopedic Surgery

## 2014-11-11 DIAGNOSIS — Z01812 Encounter for preprocedural laboratory examination: Secondary | ICD-10-CM | POA: Diagnosis not present

## 2014-11-11 LAB — BASIC METABOLIC PANEL
Anion gap: 11 (ref 5–15)
BUN: 11 mg/dL (ref 6–23)
CO2: 29 mEq/L (ref 19–32)
Calcium: 9.6 mg/dL (ref 8.4–10.5)
Chloride: 94 mEq/L — ABNORMAL LOW (ref 96–112)
Creatinine, Ser: 0.93 mg/dL (ref 0.50–1.35)
GFR calc Af Amer: >90 mL/min (ref >90)
GFR calc non Af Amer: >90 mL/min (ref >90)
Glucose, Bld: 111 mg/dL — ABNORMAL HIGH (ref 70–99)
POTASSIUM: 3.5 meq/L — AB (ref 3.7–5.3)
Sodium: 134 mEq/L — ABNORMAL LOW (ref 137–147)

## 2014-11-11 LAB — CBC
HEMATOCRIT: 42.6 % (ref 39.0–52.0)
Hemoglobin: 14.5 g/dL (ref 13.0–17.0)
MCH: 29.8 pg (ref 26.0–34.0)
MCHC: 34 g/dL (ref 30.0–36.0)
MCV: 87.5 fL (ref 78.0–100.0)
Platelets: 359 10*3/uL (ref 150–400)
RBC: 4.87 MIL/uL (ref 4.22–5.81)
RDW: 13.1 % (ref 11.5–15.5)
WBC: 6.1 10*3/uL (ref 4.0–10.5)

## 2014-11-11 NOTE — Progress Notes (Signed)
   11/11/14 1438  OBSTRUCTIVE SLEEP APNEA  Have you ever been diagnosed with sleep apnea through a sleep study? No  Do you snore loudly (loud enough to be heard through closed doors)?  0  Do you often feel tired, fatigued, or sleepy during the daytime? 0  Has anyone observed you stop breathing during your sleep? 0  Do you have, or are you being treated for high blood pressure? 1  BMI more than 35 kg/m2? 0  Age over 52 years old? 1  Neck circumference greater than 40 cm/16 inches? 1  Gender: 1  Obstructive Sleep Apnea Score 4  Score 4 or greater  Results sent to PCP

## 2014-11-11 NOTE — Progress Notes (Signed)
Preoperative surgical orders have been place into the Epic hospital system for Damon Butler on 11/11/2014, 1:42 PM  by Patrica DuelPERKINS, [name] for surgery on 11-25-2014.  Preop Knee Scope orders including IV Tylenol and IV Decadron as long as there are no contraindications to the above medications. [name], PA-C

## 2014-11-24 DIAGNOSIS — M25869 Other specified joint disorders, unspecified knee: Secondary | ICD-10-CM

## 2014-11-24 DIAGNOSIS — Z96659 Presence of unspecified artificial knee joint: Secondary | ICD-10-CM

## 2014-11-24 NOTE — H&P (Signed)
CC- Damon Butler is a 53 y.o. male who presents with right knee pain.  HPI- . Knee Pain: Patient presents with knee pain involving the  right knee. Onset of the symptoms was several months ago. Inciting event: right Total Knee Arthroplasty. Current symptoms include crepitus sensation. Pain is aggravated by going up and down stairs and rising after sitting.  Patient has had prior knee problems. Evaluation to date: plain films: normal. Treatment to date: PT which was somewhat effective. 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 Allergy   . Heart murmur   . Anxiety   . Hypertension   . Sinus problem   . Arthritis     OA AND PAIN RT KNEE    Past Surgical History  Procedure Laterality Date  . Keloid surgery      BACK OF NECK  1986 & 1988  . Fracture surgery  1982    RT KNEE  . Total knee arthroplasty Right 03/24/2014    Procedure: RIGHT TOTAL KNEE ARTHROPLASTY WITH HARDWARE REMOVAL;  Surgeon: Loanne DrillingFrank V Anya Murphey, MD;  Location: [facility];  Service: Orthopedics;  Laterality: Right;    Prior to Admission medications   Medication Sig Start Date End Date Taking? Authorizing Provider  aspirin 81 MG tablet Take 81 mg by mouth daily after lunch.    Yes Historical Provider, MD  desloratadine (CLARINEX) 5 MG tablet Take 1 tablet (5 mg total) by mouth daily. Patient taking differently: Take 5 mg by mouth daily as needed (allergies).  10/20/14  Yes Todd McVeigh, PA  Fish Oil-Cholecalciferol (FISH OIL + D3 PO) Take 1 capsule by mouth daily after lunch.    Yes Historical Provider, MD  fluticasone (FLONASE) 50 MCG/ACT nasal spray Place 2 sprays into both nostrils daily. Patient taking differently: Place 2 sprays into both nostrils daily as needed for allergies or rhinitis. In the am 10/20/14  Yes Todd McVeigh, PA  hydrochlorothiazide (MICROZIDE) 12.5 MG capsule Take 1 capsule (12.5 mg  total) by mouth daily. Patient taking differently: Take 12.5 mg by mouth daily after lunch.  10/20/14  Yes Todd McVeigh, PA  naproxen sodium (ANAPROX) 220 MG tablet Take 220-440 mg by mouth daily as needed (pain).    Yes Historical Provider, MD  NIFEdipine (PROCARDIA XL/ADALAT-CC) 60 MG 24 hr tablet Take 1 tablet (60 mg total) by mouth daily with lunch. 10/20/14  Yes Todd McVeigh, PA  Propylene Glycol (SYSTANE BALANCE OP) Place 1 drop into both eyes daily as needed (lubrication.).   Yes Historical Provider, MD  methocarbamol (ROBAXIN) 500 MG tablet Take 1 tablet (500 mg total) by mouth every 6 (six) hours as needed for muscle spasms. Patient not taking: Reported on 11/11/2014 03/25/14   Amber Tamala SerLauren Constable, PA-C   KNEE EXAM antalgic gait, effusion, collateral ligaments intact, painful crepitus on range of motion of right knee, range 0-120  Physical Examination: General appearance - alert, well appearing, and in no distress Mental status - alert, oriented to person, place, and time Chest - clear to auscultation, no wheezes, rales or rhonchi, symmetric air entry Heart - normal rate, regular rhythm, normal S1, S2, no murmurs, rubs, clicks or gallops Abdomen - soft, nontender, nondistended, no masses or organomegaly Neurological - alert, oriented, normal speech, no focal findings or movement disorder noted    Asessment/Plan--- Right knee  Patellar clunk syndrome- - Plan rightt knee arthroscopy with synovectomy. Procedure risks and potential  comps discussed with patient who elects to proceed. Goals are decreased pain and increased function with a high likelihood of achieving both

## 2014-11-25 ENCOUNTER — Encounter (HOSPITAL_COMMUNITY)
Admission: RE | Disposition: A | Discharge status: Home / Self Care | Payer: Self-pay | Source: Ambulatory Visit | Attending: Orthopedic Surgery

## 2014-11-25 ENCOUNTER — Ambulatory Visit (HOSPITAL_COMMUNITY)
Admission: RE | Admit: 2014-11-25 | Discharge: 2014-11-25 | Disposition: A | Discharge status: Home / Self Care | Payer: BC Managed Care – PPO | Source: Ambulatory Visit | Attending: Orthopedic Surgery | Admitting: Orthopedic Surgery

## 2014-11-25 ENCOUNTER — Encounter (HOSPITAL_COMMUNITY): Payer: Self-pay | Admitting: *Deleted

## 2014-11-25 ENCOUNTER — Ambulatory Visit (HOSPITAL_COMMUNITY): Payer: BC Managed Care – PPO | Admitting: Anesthesiology

## 2014-11-25 DIAGNOSIS — Z7982 Long term (current) use of aspirin: Secondary | ICD-10-CM | POA: Diagnosis not present

## 2014-11-25 DIAGNOSIS — M25869 Other specified joint disorders, unspecified knee: Secondary | ICD-10-CM

## 2014-11-25 DIAGNOSIS — M25861 Other specified joint disorders, right knee: Secondary | ICD-10-CM | POA: Insufficient documentation

## 2014-11-25 DIAGNOSIS — Z96659 Presence of unspecified artificial knee joint: Secondary | ICD-10-CM

## 2014-11-25 DIAGNOSIS — F419 Anxiety disorder, unspecified: Secondary | ICD-10-CM | POA: Diagnosis not present

## 2014-11-25 DIAGNOSIS — M1990 Unspecified osteoarthritis, unspecified site: Secondary | ICD-10-CM | POA: Diagnosis not present

## 2014-11-25 DIAGNOSIS — I10 Essential (primary) hypertension: Secondary | ICD-10-CM | POA: Insufficient documentation

## 2014-11-25 HISTORY — PX: KNEE ARTHROSCOPY: SHX127

## 2014-11-25 SURGERY — ARTHROSCOPY, KNEE
Anesthesia: General | Site: Knee | Laterality: Right

## 2014-11-25 MED ORDER — BUPIVACAINE-EPINEPHRINE 0.25% -1:200000 IJ SOLN
INTRAMUSCULAR | Status: DC | PRN
  Administered 2014-11-25: 20 mL

## 2014-11-25 MED ORDER — MEPERIDINE HCL 50 MG/ML IJ SOLN: 6.2500 mg | INTRAMUSCULAR | Status: DC | PRN

## 2014-11-25 MED ORDER — LIDOCAINE HCL (CARDIAC) 20 MG/ML IV SOLN
INTRAVENOUS | Status: AC
  Filled 2014-11-25: qty 5

## 2014-11-25 MED ORDER — PROPOFOL 10 MG/ML IV BOLUS
INTRAVENOUS | Status: AC
  Filled 2014-11-25: qty 20

## 2014-11-25 MED ORDER — HYDROCODONE-ACETAMINOPHEN 5-325 MG PO TABS: 1.0000 | ORAL_TABLET | Freq: Four times a day (QID) | ORAL | Status: DC | PRN

## 2014-11-25 MED ORDER — ONDANSETRON HCL 4 MG/2ML IJ SOLN
INTRAMUSCULAR | Status: AC
  Filled 2014-11-25: qty 2

## 2014-11-25 MED ORDER — OXYCODONE HCL 5 MG/5ML PO SOLN
5.0000 mg | Freq: Once | ORAL | Status: DC | PRN
  Filled 2014-11-25: qty 5

## 2014-11-25 MED ORDER — LIDOCAINE HCL 1 % IJ SOLN
INTRAMUSCULAR | Status: DC | PRN
  Administered 2014-11-25: 100 mg via INTRADERMAL

## 2014-11-25 MED ORDER — HYDROMORPHONE HCL 1 MG/ML IJ SOLN
0.2500 mg | INTRAMUSCULAR | Status: DC | PRN
Start: 2014-11-25 — End: 2014-11-25

## 2014-11-25 MED ORDER — FENTANYL CITRATE 0.05 MG/ML IJ SOLN
INTRAMUSCULAR | Status: AC
  Filled 2014-11-25: qty 2

## 2014-11-25 MED ORDER — CEFAZOLIN SODIUM-DEXTROSE 2-3 GM-% IV SOLR
INTRAVENOUS | Status: AC
  Filled 2014-11-25: qty 50

## 2014-11-25 MED ORDER — PROPOFOL 10 MG/ML IV BOLUS
INTRAVENOUS | Status: DC | PRN
  Administered 2014-11-25: 200 mg via INTRAVENOUS

## 2014-11-25 MED ORDER — ACETAMINOPHEN 10 MG/ML IV SOLN
1000.0000 mg | Freq: Once | INTRAVENOUS | Status: AC
  Administered 2014-11-25: 1000 mg via INTRAVENOUS
  Filled 2014-11-25: qty 100

## 2014-11-25 MED ORDER — BUPIVACAINE-EPINEPHRINE (PF) 0.25% -1:200000 IJ SOLN
INTRAMUSCULAR | Status: AC
  Filled 2014-11-25: qty 30

## 2014-11-25 MED ORDER — LACTATED RINGERS IR SOLN
Status: DC | PRN
  Administered 2014-11-25: 6000 mL

## 2014-11-25 MED ORDER — OXYCODONE HCL 5 MG PO TABS: 5.0000 mg | ORAL_TABLET | Freq: Once | ORAL | Status: DC | PRN

## 2014-11-25 MED ORDER — METHOCARBAMOL 500 MG PO TABS: 500.0000 mg | ORAL_TABLET | Freq: Four times a day (QID) | ORAL | Status: DC

## 2014-11-25 MED ORDER — DEXAMETHASONE SODIUM PHOSPHATE 10 MG/ML IJ SOLN
INTRAMUSCULAR | Status: DC | PRN
  Administered 2014-11-25: 10 mg via INTRAVENOUS

## 2014-11-25 MED ORDER — MIDAZOLAM HCL 5 MG/5ML IJ SOLN
INTRAMUSCULAR | Status: DC | PRN
  Administered 2014-11-25: 2 mg via INTRAVENOUS

## 2014-11-25 MED ORDER — LACTATED RINGERS IV SOLN
INTRAVENOUS | Status: DC
  Administered 2014-11-25: 1000 mL via INTRAVENOUS

## 2014-11-25 MED ORDER — MIDAZOLAM HCL 2 MG/2ML IJ SOLN
INTRAMUSCULAR | Status: AC
  Filled 2014-11-25: qty 2

## 2014-11-25 MED ORDER — SODIUM CHLORIDE 0.9 % IV SOLN: INTRAVENOUS | Status: DC

## 2014-11-25 MED ORDER — ONDANSETRON HCL 4 MG/2ML IJ SOLN
INTRAMUSCULAR | Status: DC | PRN
  Administered 2014-11-25: 4 mg via INTRAVENOUS

## 2014-11-25 MED ORDER — DEXAMETHASONE SODIUM PHOSPHATE 10 MG/ML IJ SOLN: 10.0000 mg | Freq: Once | INTRAMUSCULAR | Status: DC

## 2014-11-25 MED ORDER — FENTANYL CITRATE 0.05 MG/ML IJ SOLN
INTRAMUSCULAR | Status: DC | PRN
  Administered 2014-11-25: 100 ug via INTRAVENOUS
  Administered 2014-11-25 (×2): 50 ug via INTRAVENOUS

## 2014-11-25 MED ORDER — CEFAZOLIN SODIUM-DEXTROSE 2-3 GM-% IV SOLR
2.0000 g | INTRAVENOUS | Status: AC
  Administered 2014-11-25: 2 g via INTRAVENOUS

## 2014-11-25 MED ORDER — PROMETHAZINE HCL 25 MG/ML IJ SOLN: 6.2500 mg | INTRAMUSCULAR | Status: DC | PRN

## 2014-11-25 MED ORDER — CHLORHEXIDINE GLUCONATE 4 % EX LIQD: 60.0000 mL | Freq: Once | CUTANEOUS | Status: DC

## 2014-11-25 SURGICAL SUPPLY — 55 items
BAG ZIPLOCK 12X15 (MISCELLANEOUS) ×3 IMPLANT
BANDAGE ELASTIC 6 VELCRO ST LF (GAUZE/BANDAGES/DRESSINGS) ×3 IMPLANT
BLADE 4.2CUDA (BLADE) ×3 IMPLANT
BLADE SURG SZ11 CARB STEEL (BLADE) IMPLANT
BNDG COHESIVE 4X5 TAN STRL (GAUZE/BANDAGES/DRESSINGS) ×3 IMPLANT
BNDG CONFORM 6X.82 1P STRL (GAUZE/BANDAGES/DRESSINGS) ×3 IMPLANT
BNDG GAUZE ELAST 4 BULKY (GAUZE/BANDAGES/DRESSINGS) ×3 IMPLANT
CUFF TOURN SGL QUICK 34 (TOURNIQUET CUFF) ×2
CUFF TRNQT CYL 34X4X40X1 (TOURNIQUET CUFF) ×4 IMPLANT
DRAPE EXTREMITY T 121X128X90 (DRAPE) ×3 IMPLANT
DRAPE INCISE IOBAN 66X45 STRL (DRAPES) ×3 IMPLANT
DRAPE U-SHAPE 47X51 STRL (DRAPES) ×9 IMPLANT
DRSG EMULSION OIL 3X16 NADH (GAUZE/BANDAGES/DRESSINGS) ×3 IMPLANT
DRSG EMULSION OIL 3X3 NADH (GAUZE/BANDAGES/DRESSINGS) ×3 IMPLANT
DRSG PAD ABDOMINAL 8X10 ST (GAUZE/BANDAGES/DRESSINGS) ×3 IMPLANT
DURAPREP 26ML APPLICATOR (WOUND CARE) ×3 IMPLANT
ELECT REM PT RETURN 9FT ADLT (ELECTROSURGICAL) ×3
ELECTRODE REM PT RTRN 9FT ADLT (ELECTROSURGICAL) ×2 IMPLANT
GAUZE SPONGE 4X4 12PLY STRL (GAUZE/BANDAGES/DRESSINGS) ×3 IMPLANT
GLOVE BIO SURGEON STRL SZ7.5 (GLOVE) ×3 IMPLANT
GLOVE BIO SURGEON STRL SZ8 (GLOVE) ×3 IMPLANT
GLOVE BIOGEL PI IND STRL 8 (GLOVE) ×2 IMPLANT
GLOVE BIOGEL PI IND STRL 8.5 (GLOVE) ×2 IMPLANT
GLOVE BIOGEL PI INDICATOR 8 (GLOVE) ×1
GLOVE BIOGEL PI INDICATOR 8.5 (GLOVE) ×1
GOWN STRL REUS W/TWL LRG LVL3 (GOWN DISPOSABLE) ×6 IMPLANT
GOWN STRL REUS W/TWL XL LVL3 (GOWN DISPOSABLE) ×6 IMPLANT
IMMOBILIZER KNEE 20 (SOFTGOODS) ×6 IMPLANT
IMMOBILIZER KNEE 20 THIGH 36 (SOFTGOODS) ×2 IMPLANT
KIT BASIN OR (CUSTOM PROCEDURE TRAY) ×3 IMPLANT
MANIFOLD NEPTUNE II (INSTRUMENTS) ×3 IMPLANT
NDL SAFETY ECLIPSE 18X1.5 (NEEDLE) ×2 IMPLANT
NEEDLE HYPO 18GX1.5 SHARP (NEEDLE) ×1
NS IRRIG 1000ML POUR BTL (IV SOLUTION) ×3 IMPLANT
PACK ARTHROSCOPY WL (CUSTOM PROCEDURE TRAY) ×3 IMPLANT
PACK ICE MAXI GEL EZY WRAP (MISCELLANEOUS) ×9 IMPLANT
PACK ORTHO EXTREMITY (CUSTOM PROCEDURE TRAY) ×3 IMPLANT
PAD ABD 8X10 STRL (GAUZE/BANDAGES/DRESSINGS) ×3 IMPLANT
PAD CAST 4YDX4 CTTN HI CHSV (CAST SUPPLIES) ×2 IMPLANT
PADDING CAST COTTON 4X4 STRL (CAST SUPPLIES) ×1
PADDING CAST COTTON 6X4 STRL (CAST SUPPLIES) ×6 IMPLANT
POSITIONER SURGICAL ARM (MISCELLANEOUS) ×3 IMPLANT
SET ARTHROSCOPY TUBING (MISCELLANEOUS) ×1
SET ARTHROSCOPY TUBING LN (MISCELLANEOUS) ×2 IMPLANT
SPONGE LAP 4X18 X RAY DECT (DISPOSABLE) ×3 IMPLANT
SUT ETHILON 4 0 PS 2 18 (SUTURE) ×3 IMPLANT
SUT VIC AB 1 CT1 27 (SUTURE) ×1
SUT VIC AB 1 CT1 27XBRD ANTBC (SUTURE) ×2 IMPLANT
SUT VIC AB 2-0 CT1 27 (SUTURE) ×1
SUT VIC AB 2-0 CT1 27XBRD (SUTURE) ×2 IMPLANT
SYR 50ML LL SCALE MARK (SYRINGE) ×3 IMPLANT
TOWEL OR 17X26 10 PK STRL BLUE (TOWEL DISPOSABLE) ×6 IMPLANT
WAND 90 DEG TURBOVAC W/CORD (SURGICAL WAND) ×3 IMPLANT
WATER STERILE IRR 1500ML POUR (IV SOLUTION) ×3 IMPLANT
WRAP KNEE MAXI GEL POST OP (GAUZE/BANDAGES/DRESSINGS) ×3 IMPLANT

## 2014-11-25 NOTE — Interval H&P Note (Signed)
History and Physical Interval Note:  11/25/2014 1:01 PM  Damon Butler  has presented today for surgery, with the diagnosis of RIGHT KNEE PATELLA CLUNK SYNDROME/LEFT KNEE PRE-PATELLA BURSITIS  The various methods of treatment have been discussed with the patient and family. After consideration of risks, benefits and other options for treatment, the patient has consented to  Procedure(s): RIGHT ARTHROSCOPY KNEE/SYNOVECTOMY (Right) LEFT KNEE EXCISION OF PRE-PATELLA  BURSA (Left) as a surgical intervention .  The patient's history has been reviewed, patient examined, no change in status, stable for surgery.  I have reviewed the patient's chart and labs.  Questions were answered to the patient's satisfaction.    Note that his left knee bursitis has resolved on exam today and he will not need surgery on the left knee.   Loanne DrillingALUISIO,Aubryn Spinola V

## 2014-11-25 NOTE — Anesthesia Postprocedure Evaluation (Signed)
Anesthesia Post Note  Patient: Damon Butler  Procedure(s) Performed: Procedure(s) (LRB): RIGHT ARTHROSCOPY KNEE/SYNOVECTOMY (Right)  Anesthesia type: General  Patient location: PACU  Post pain: Pain level controlled  Post assessment: Post-op Vital signs reviewed  Last Vitals: BP 124/88 mmHg  Pulse 72  Temp(Src) 36.6 C  Resp 21  Ht 5\' 11"  (1.803 m)  Wt 176 lb (79.833 kg)  BMI 24.56 kg/m2  SpO2 98%  Post vital signs: Reviewed  Level of consciousness: sedated  Complications: No apparent anesthesia complications

## 2014-11-25 NOTE — Brief Op Note (Signed)
11/25/2014  1:56 PM  PATIENT:  Damon Butler  53 y.o. male  PRE-OPERATIVE DIAGNOSIS:  RIGHT KNEE PATELLA CLUNK SYNDROME  POST-OPERATIVE DIAGNOSIS:  RIGHT KNEE PATELLA CLUNK SYNDROME  PROCEDURE:  Procedure(s): RIGHT ARTHROSCOPY KNEE/SYNOVECTOMY (Right)  SURGEON:  Surgeon(s) and Role:    * Loanne DrillingFrank Tramar Brueckner V, MD - Primary  PHYSICIAN ASSISTANT:   ASSISTANTS: none   ANESTHESIA:   general  EBL:     DRAINS: none   LOCAL MEDICATIONS USED:  MARCAINE     COUNTS:  YES  TOURNIQUET:  * No tourniquets in log *  DICTATION: .Other Dictation: Dictation Number [id number]  PLAN OF CARE: Discharge to home after PACU  PATIENT DISPOSITION:  PACU - hemodynamically stable.

## 2014-11-25 NOTE — Discharge Instructions (Signed)
Arthroscopic Procedure, Knee °An arthroscopic procedure can find what is wrong with your knee. °PROCEDURE °Arthroscopy is a surgical technique that allows your orthopedic surgeon to diagnose and treat your knee injury with accuracy. They will look into your knee through a small instrument. This is almost like a small (pencil sized) telescope. Because arthroscopy affects your knee less than open knee surgery, you can anticipate a more rapid recovery. Taking an active role by following your caregiver's instructions will help with rapid and complete recovery. Use crutches, rest, elevation, ice, and knee exercises as instructed. The length of recovery depends on various factors including type of injury, age, physical condition, medical conditions, and your rehabilitation. °Your knee is the joint between the large bones (femur and tibia) in your leg. Cartilage covers these bone ends which are smooth and slippery and allow your knee to bend and move smoothly. Two menisci, thick, semi-lunar shaped pads of cartilage which form a rim inside the joint, help absorb shock and stabilize your knee. Ligaments bind the bones together and support your knee joint. Muscles move the joint, help support your knee, and take stress off the joint itself. Because of this all programs and physical therapy to rehabilitate an injured or repaired knee require rebuilding and strengthening your muscles. °AFTER THE PROCEDURE °· After the procedure, you will be moved to a recovery area until most of the effects of the medication have worn off. Your caregiver will discuss the test results with you.  °· Only take over-the-counter or prescription medicines for pain, discomfort, or fever as directed by your caregiver.  °SEEK MEDICAL CARE IF:  °· You have increased bleeding from your wounds.  °· You see redness, swelling, or have increasing pain in your wounds.  °· You have pus coming from your wound.  °· You have an oral temperature above 102° F (38.9°  C).  °· You notice a bad smell coming from the wound or dressing.  °· You have severe pain with any motion of your knee.  °SEEK IMMEDIATE MEDICAL CARE IF:  °· You develop a rash.  °· You have difficulty breathing.  °· You have any allergic problems.  °FURTHER INSTRUCTIONS: °· You may start showering two days after being discharged home but do not submerge the incisions under water.  °· Change dressing 48 hours after the procedure and then cover the small incisions with band aids until your follow up visit. °· Avoid periods of inactivity such as sitting longer than an hour when not asleep. This helps prevent blood clots.  °· You may put full weight on your legs and walk as much as is comfortable.  °· Do not drive while taking narcotics.  °Wear the elastic stockings for three weeks following surgery during the day but you may remove then at night. °· Make sure you keep all of your appointments after your operation with all of your doctors and caregivers. You should call the office at [phone] and make an appointment for approximately one week after the date of your surgery. °· Please pick up a stool softener and laxative for home use as long as you are requiring pain medications. °· ICE to the affected knee every three hours for 30 minutes at a time and then as needed for pain and swelling.  Continue to use ice on the knee for pain and swelling from surgery. You may notice swelling that will progress down to the foot and ankle.  This is normal after surgery.  Elevate the   leg when you are not up walking on it.   °RANGE OF MOTION AND STRENGTHENING EXERCISES  °Rehabilitation of the knee is important following a knee injury or an operation. After just a few days of immobilization, the muscles of the thigh which control the knee become weakened and shrink (atrophy). Knee exercises are designed to build up the tone and strength of the thigh muscles and to improve knee motion. Often times heat used for twenty to thirty  minutes before working out will loosen up your tissues and help with improving the range of motion but do not use heat for the first two weeks following surgery. These exercises can be done on a training (exercise) mat, on the floor, on a table or on a bed. Use what ever works the best and is most comfortable for you Knee exercises include: ° ° ° ° ° ° °QUAD STRENGTHENING EXERCISES °Strengthening Quadriceps Sets ° °Tighten muscles on top of thigh by pushing knees down into floor or table. °Hold for 20 seconds. Repeat 10 times. °Do 2 sessions per day. ° ° ° °Strengthening Terminal Knee Extension ° °With knee bent over bolster, straighten knee by tightening muscle on top of thigh. Be sure to keep bottom of knee on bolster. °Hold for 20 seconds. Repeat 10 times. °Do 2 sessions per day. ° ° °Straight Leg with Bent Knee ° °Lie on back with opposite leg bent. Keep involved knee slightly bent at knee and raise leg 4-6". Hold for 10 seconds. °Repeat 20 times per set. °Do 2 sets per session. °Do 2 sessions per day. ° °

## 2014-11-25 NOTE — Transfer of Care (Signed)
Immediate Anesthesia Transfer of Care Note  Patient: Damon Butler  Procedure(s) Performed: Procedure(s): RIGHT ARTHROSCOPY KNEE/SYNOVECTOMY (Right)  Patient Location: PACU  Anesthesia Type:General  Level of Consciousness: awake, alert , oriented and sedated  Airway & Oxygen Therapy: Patient Spontanous Breathing and Patient connected to face mask oxygen  Post-op Assessment: Report given to PACU RN and Post -op Vital signs reviewed and stable  Post vital signs: Reviewed and stable  Complications: No apparent anesthesia complications

## 2014-11-25 NOTE — Op Note (Signed)
NAMMarcille Butler:  Damon Butler, Damon Butler          ACCOUNT NO.:  [id number]  MEDICAL RECORD NO.:  [id number]  LOCATION:  [facility]                         [facility]  PHYSICIAN:  Damon GrossFrank Xxavier Butler, M.D.    DATE OF BIRTH:  1961/05/03  DATE OF PROCEDURE:  11/25/2014 DATE OF DISCHARGE:  11/25/2014                              OPERATIVE REPORT   PREOPERATIVE DIAGNOSIS:  Right knee patellar clunk syndrome.  POSTOPERATIVE DIAGNOSIS:  Right knee patellar clunk syndrome.  PROCEDURE:  Right knee arthroscopy with synovectomy.  SURGEON:  Damon GrossFrank Macallister Butler, M.D.  No assistant.  ANESTHESIA:  General.  ESTIMATED BLOOD LOSS:  Minimal.  DRAINS:  None.  COMPLICATIONS:  None.  CONDITION:  Stable to recovery.  BRIEF CLINICAL NOTE:  Damon LaymanLindsey is a 53 year old male who had a right total knee arthroplasty done approximately 8 months ago.  He had done well initially and then developed painful popping in the knee consistent with patellar clunk syndrome.  He has failed nonoperative management and presents now for right knee arthroscopy with synovectomy.  In addition, upon last presentation to the office, he had a large left prepatellar bursa.  We had also planned on excising that.  Since I saw him last, the symptoms completely resolved.  On exam today, he had no further evidence of a bursa, thus we decided not to proceed on the left.  He presents now for right knee arthroscopy and synovectomy.  PROCEDURE IN DETAIL:  After successful administration of general anesthetic, a tourniquet was placed high on his right thigh and his right lower extremity, was prepped and draped in the usual sterile fashion.  Standard superomedial and inferolateral incisions were made. Inflow cannula passed superomedial.  Camera passed inferolateral. Arthroscopic visualization proceeds.  There was a large amount of hypertrophic synovial tissue present at the junction of the quadriceps tendon and patella.  It obliterated the suprapatellar  pouch.  A third portal was made at the superolateral position.  The skin was cut with a #11 blade and then the joint was entered with a shaver.  Collene MaresShaver was used to debride the synovitis.  A combination of the shaver and the ArthroCare device was used to remove the hypertrophic synovium and cauterized it.  There was a tremendous amount of suprapatellar area and then finished the suprapatellar pouch, it was restored.  There was also exist the height synovium in the lateral gutter and I debrided that with a combination of the shaver and the ArthroCare.  Joints again inspected. No other abnormal tissue was noted.  The arthroscopic equipment was then removed from the lateral portals, which were subsequently closed with interrupted 4-0 nylon.  A 20 mL of 0.25% Marcaine with epinephrine was injected through the inflow cannula that was removed and that portal was closed with nylon.  Incisions were cleaned and dried and a bulky sterile dressing was applied.  He was then awakened and transferred to recovery in stable condition.  A sterile dressing applied.     Damon GrossFrank [name], M.D.     FA/MEDQ  D:  11/25/2014  T:  11/25/2014  Job:  [id number]

## 2014-11-25 NOTE — Anesthesia Preprocedure Evaluation (Addendum)
Anesthesia Evaluation  Patient identified by MRN, date of birth, ID band Patient awake    Reviewed: Allergy & Precautions, H&P , NPO status , Patient's Chart, lab work & pertinent test results  Airway Mallampati: II  TM Distance: >3 FB Neck ROM: Full    Dental no notable dental hx.    Pulmonary neg pulmonary ROS,  breath sounds clear to auscultation  Pulmonary exam normal       Cardiovascular hypertension, Pt. on medications + Valvular Problems/Murmurs Rhythm:Regular Rate:Normal     Neuro/Psych PSYCHIATRIC DISORDERS Anxiety negative neurological ROS  negative psych ROS   GI/Hepatic negative GI ROS, Neg liver ROS,   Endo/Other  negative endocrine ROS  Renal/GU negative Renal ROS  negative genitourinary   Musculoskeletal  (+) Arthritis -,   Abdominal   Peds negative pediatric ROS (+)  Hematology negative hematology ROS (+)   Anesthesia Other Findings   Reproductive/Obstetrics negative OB ROS                            Anesthesia Physical  Anesthesia Plan  ASA: II  Anesthesia Plan: General   Post-op Pain Management:    Induction: Intravenous  Airway Management Planned: LMA  Additional Equipment:   Intra-op Plan:   Post-operative Plan: Extubation in OR  Informed Consent: I have reviewed the patients History and Physical, chart, labs and discussed the procedure including the risks, benefits and alternatives for the proposed anesthesia with the patient or authorized representative who has indicated his/her understanding and acceptance.   Dental advisory given  Plan Discussed with: CRNA  Anesthesia Plan Comments:        Anesthesia Quick Evaluation

## 2014-11-26 ENCOUNTER — Encounter (HOSPITAL_COMMUNITY): Payer: Self-pay | Admitting: Orthopedic Surgery

## 2015-09-20 IMAGING — CR DG CHEST 2V
2 series · 2 of 2 positions shown · non-contrast
Comparison: None.

CLINICAL DATA: Preop for right hip replacement

EXAM:
CHEST  2 VIEW

[w chest pa]
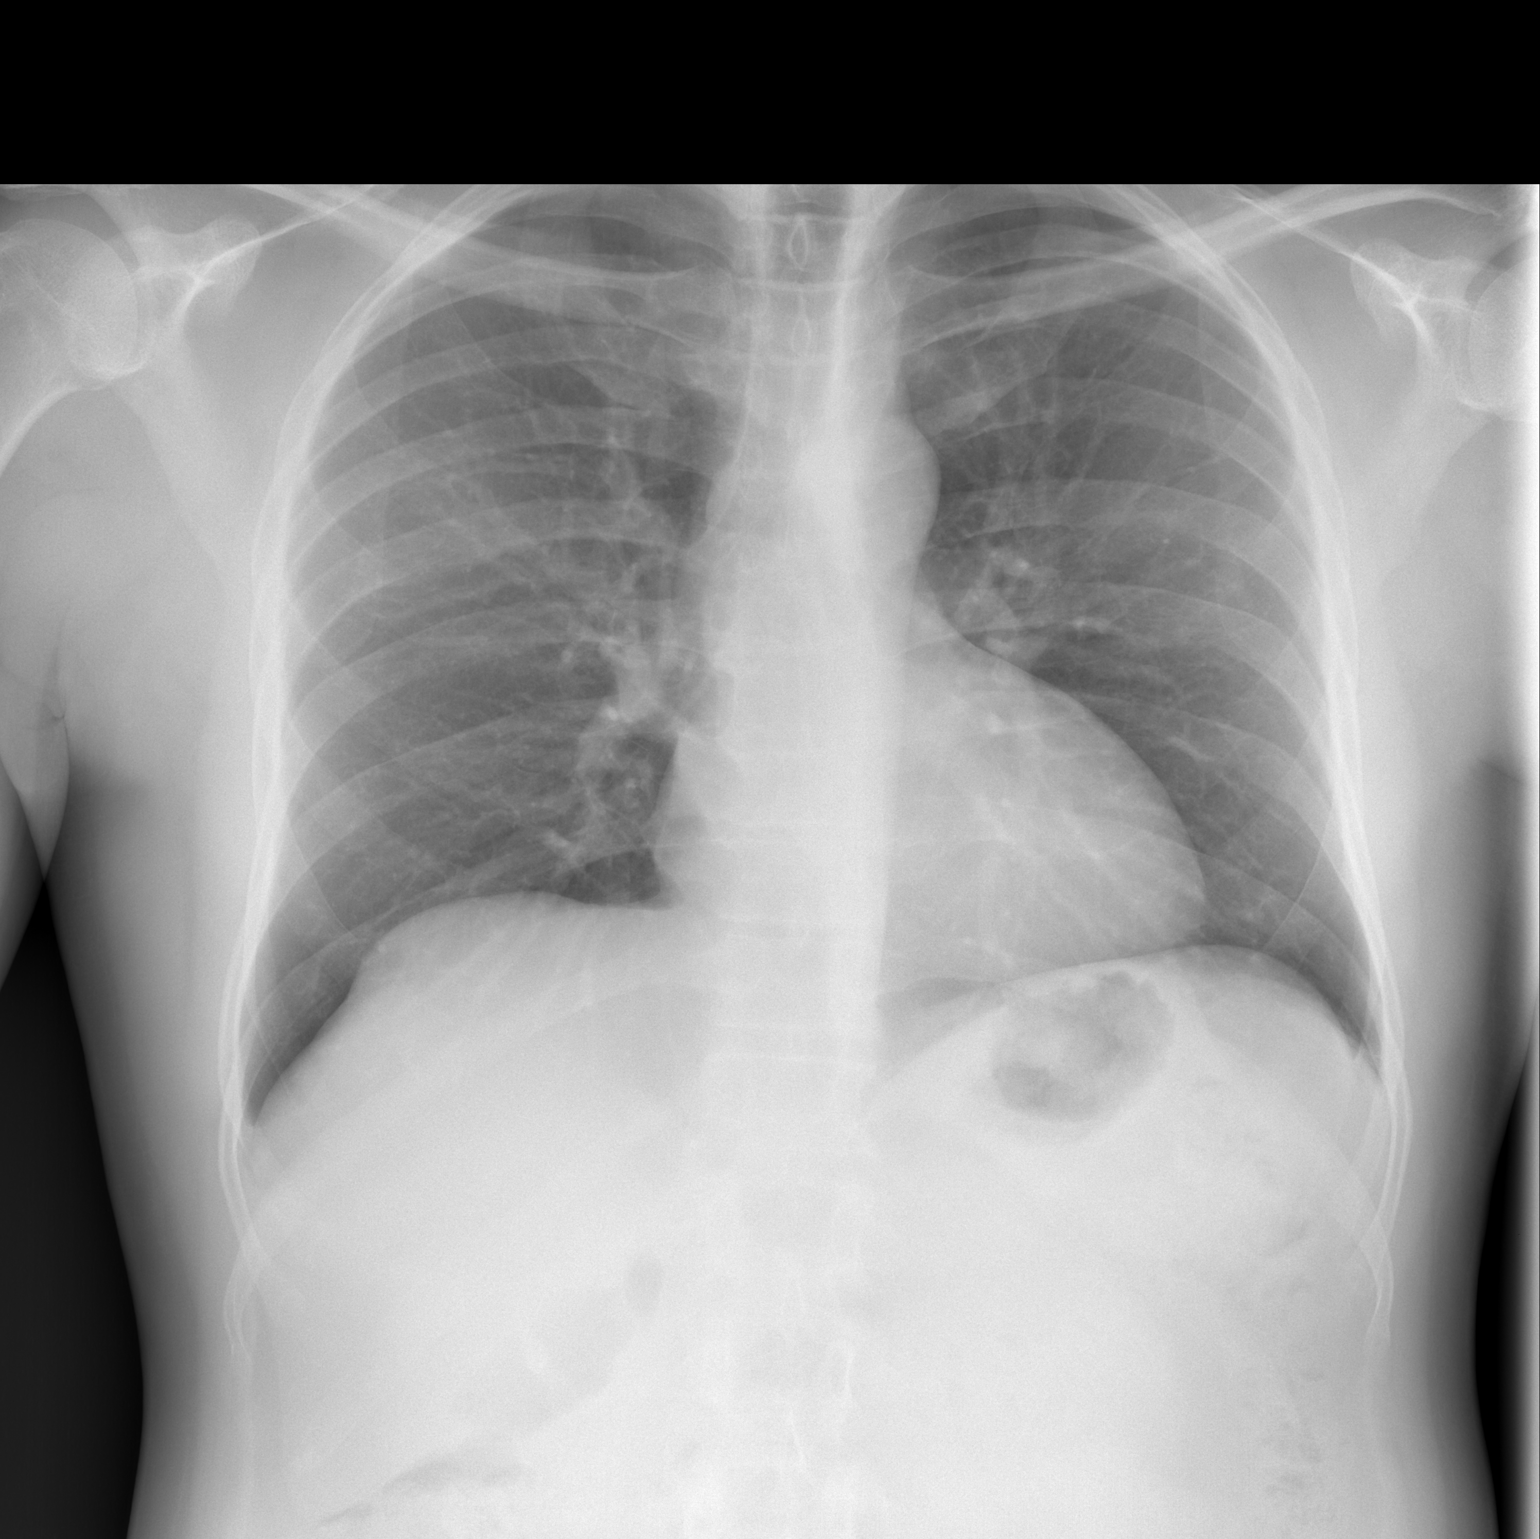

[w chest lat]
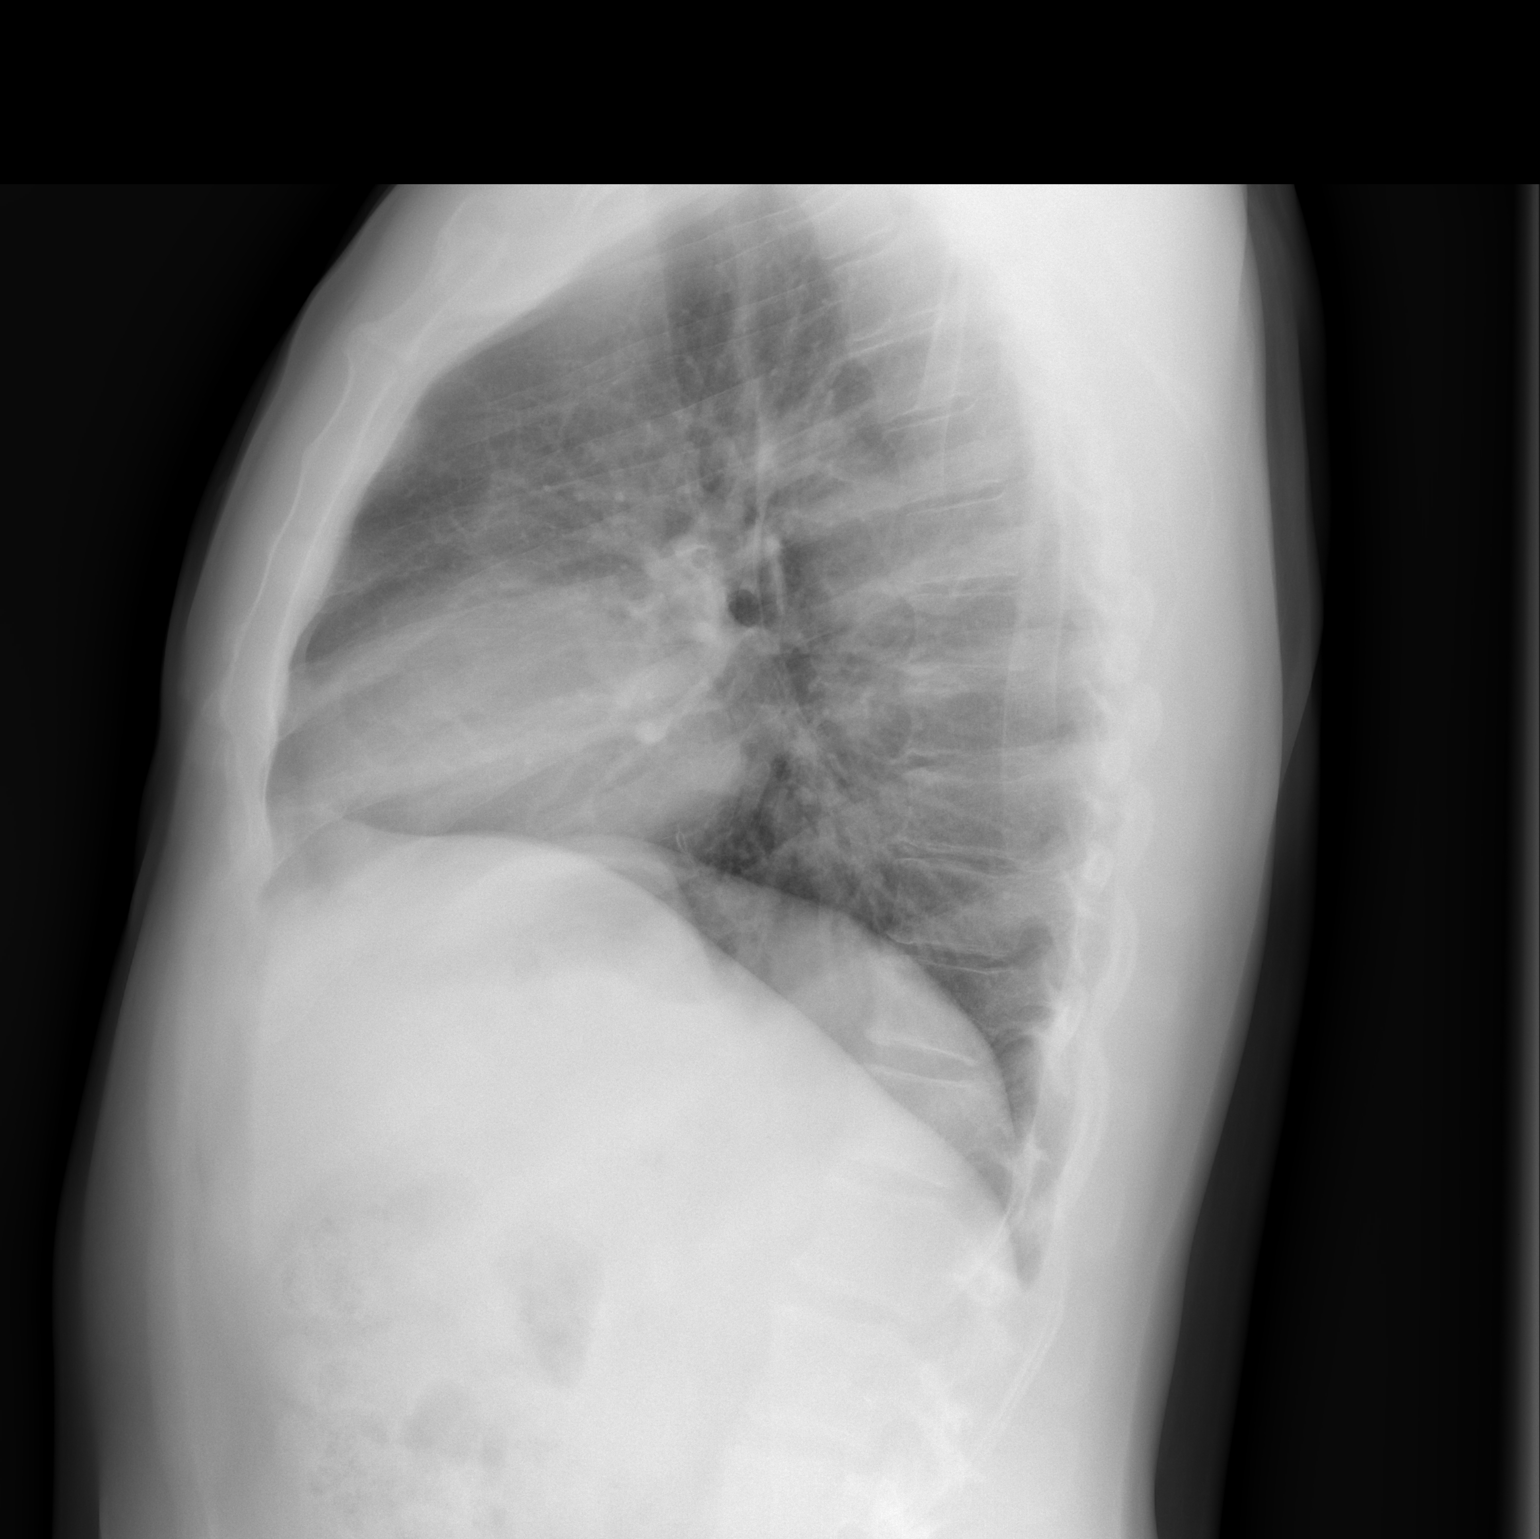

[2 of 2 positions shown; findings below may reference images not displayed]

FINDINGS: Cardiomediastinal silhouette is unremarkable. No acute infiltrate or
pleural effusion. No pulmonary edema. Bony thorax is unremarkable.
IMPRESSION: No active cardiopulmonary disease.

## 2015-09-29 ENCOUNTER — Other Ambulatory Visit: Payer: Self-pay | Admitting: Physician Assistant

## 2015-10-30 ENCOUNTER — Other Ambulatory Visit: Payer: Self-pay | Admitting: Physician Assistant

## 2015-11-05 ENCOUNTER — Telehealth: Payer: Self-pay

## 2015-11-05 NOTE — Telephone Encounter (Signed)
Pt req. Rx refill on Microzide 12.5 MG and NIFEdipine (NIFEDICAL  Advised OV is req. [phone]

## 2015-11-08 ENCOUNTER — Ambulatory Visit (INDEPENDENT_AMBULATORY_CARE_PROVIDER_SITE_OTHER): Payer: 59 | Admitting: Physician Assistant

## 2015-11-08 DIAGNOSIS — E7800 Pure hypercholesterolemia, unspecified: Secondary | ICD-10-CM

## 2015-11-08 DIAGNOSIS — J302 Other seasonal allergic rhinitis: Secondary | ICD-10-CM

## 2015-11-08 DIAGNOSIS — I10 Essential (primary) hypertension: Secondary | ICD-10-CM

## 2015-11-08 LAB — COMPLETE METABOLIC PANEL WITH GFR
ALT: 18 U/L (ref 9–46)
AST: 21 U/L (ref 10–35)
Albumin: 4.1 g/dL (ref 3.6–5.1)
Alkaline Phosphatase: 61 U/L (ref 40–115)
BUN: 9 mg/dL (ref 7–25)
CALCIUM: 9 mg/dL (ref 8.6–10.3)
CO2: 30 mmol/L (ref 20–31)
Chloride: 100 mmol/L (ref 98–110)
Creat: 1.02 mg/dL (ref 0.70–1.33)
GFR, Est African American: >89 mL/min (ref >=60)
GFR, Est Non African American: 84 mL/min (ref >=60)
Glucose, Bld: 105 mg/dL — ABNORMAL HIGH (ref 65–99)
POTASSIUM: 3.9 mmol/L (ref 3.5–5.3)
SODIUM: 136 mmol/L (ref 135–146)
Total Bilirubin: 0.6 mg/dL (ref 0.2–1.2)
Total Protein: 7.2 g/dL (ref 6.1–8.1)

## 2015-11-08 LAB — LIPID PANEL
CHOL/HDL RATIO: 2.6 ratio (ref <=5.0)
CHOLESTEROL: 152 mg/dL (ref 125–200)
HDL: 58 mg/dL (ref >=40)
LDL Cholesterol: 83 mg/dL (ref <130)
Triglycerides: 53 mg/dL (ref <150)
VLDL: 11 mg/dL (ref <30)

## 2015-11-08 LAB — TSH: TSH: 0.695 u[IU]/mL (ref 0.350–4.500)

## 2015-11-08 MED ORDER — NIFEDIPINE ER OSMOTIC RELEASE 60 MG PO TB24: 60.0000 mg | ORAL_TABLET | Freq: Every day | ORAL | Status: DC

## 2015-11-08 MED ORDER — HYDROCHLOROTHIAZIDE 12.5 MG PO CAPS: 12.5000 mg | ORAL_CAPSULE | Freq: Every day | ORAL | Status: DC

## 2015-11-08 MED ORDER — DESLORATADINE 5 MG PO TABS: 5.0000 mg | ORAL_TABLET | Freq: Every day | ORAL | Status: DC

## 2015-11-08 MED ORDER — FLUTICASONE PROPIONATE 50 MCG/ACT NA SUSP: 2.0000 | Freq: Every day | NASAL | Status: DC

## 2015-11-08 NOTE — Patient Instructions (Addendum)
Dentist - [phone] - Dr Lawrence Marseillesivils

## 2015-11-08 NOTE — Progress Notes (Signed)
   Damon GerlachLindsey Q Butler  MRN: [id number] DOB: Jun 10, 1961  Subjective:  Pt presents to clinic for HTN recheck and medication refills.  He has been doing well.  He is tolerating his medications ok.  He is currently not taking his allergies medication but wants to make sure he has it for the spring time.  He is changing his insurance in the beginning of the year so he is not sure how his medications are covered.  He is having these episodes of fluttering his in chest that he does not believe are related to anything - he has no chest pain and no LE edema and he feels like the rate is maybe fast for a short (seconds) time.  He does not think related to change in position.  He knows he gets nervous and thinks it might be related to that.  He does not get SOB with these episodes.  Patient Active Problem List   Diagnosis Date Noted  . 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 OA (osteoarthritis) of knee 03/24/2014  . Other and unspecified hyperlipidemia 08/12/2013  . Acne 08/12/2013  . HTN (hypertension) 11/12/2012    Current Outpatient Prescriptions on File Prior to Visit  Medication Sig Dispense Refill  . aspirin 81 MG tablet Take 81 mg by mouth daily after lunch.     . Fish Oil-Cholecalciferol (FISH OIL + D3 PO) Take 1 capsule by mouth daily after lunch.     . Propylene Glycol (SYSTANE BALANCE OP) Place 1 drop into both eyes daily as needed (lubrication.).     No current facility-administered medications on file prior to visit.    Allergies  Allergen Reactions  . Lisinopril Swelling    LIPS     Review of Systems  Respiratory: Negative for cough and shortness of breath.   Cardiovascular: Negative for chest pain, palpitations and leg swelling.   Objective:  BP 130/62 mmHg  Pulse 101  Temp(Src) 98.7 F (37.1 C) (Oral)  Resp 16  Ht 5\' 11"  (1.803 m)  Wt 181 lb (82.101 kg)  BMI 25.26 kg/m2  SpO2 98%  Physical Exam  Constitutional: He is oriented  to person, place, and time and well-developed, well-nourished, and in no distress.  HENT:  Head: Normocephalic and atraumatic.  Right Ear: External ear normal.  Left Ear: External ear normal.  Eyes: Conjunctivae are normal.  Neck: Normal range of motion.  Cardiovascular: Normal rate, regular rhythm, normal heart sounds and intact distal pulses.   Pulmonary/Chest: Effort normal and breath sounds normal. He has no wheezes.  Musculoskeletal:       Right lower leg: He exhibits no edema.       Left lower leg: He exhibits no edema.  Neurological: He is alert and oriented to person, place, and time. Gait normal.  Skin: Skin is warm and dry.  Psychiatric: Mood, memory, affect and judgment normal.    Assessment and Plan :  Elevated cholesterol - Plan: Lipid panel  Benign essential HTN - Plan: COMPLETE METABOLIC PANEL WITH GFR, hydrochlorothiazide (MICROZIDE) 12.5 MG capsule, NIFEdipine (NIFEDICAL XL) 60 MG 24 hr tablet, TSH  Seasonal allergies - Plan: desloratadine (CLARINEX) 5 MG tablet, fluticasone (FLONASE) 50 MCG/ACT nasal spray   Continue medications - monitor the "fluttering' sensation.  Recheck in 6 months unless he is having problems earlier than that.  Benny LennertSarah Jair Lindblad PA-C  [facility] 11/08/2015 2:54 PM

## 2016-02-25 ENCOUNTER — Ambulatory Visit (INDEPENDENT_AMBULATORY_CARE_PROVIDER_SITE_OTHER): Payer: BLUE CROSS/BLUE SHIELD | Admitting: Family Medicine

## 2016-02-25 ENCOUNTER — Telehealth: Payer: Self-pay

## 2016-02-25 VITALS — BP 134/90 | HR 108 | Temp 99.0°F | Resp 18 | Ht 71.0 in | Wt 177.6 lb

## 2016-02-25 DIAGNOSIS — I10 Essential (primary) hypertension: Secondary | ICD-10-CM | POA: Diagnosis not present

## 2016-02-25 DIAGNOSIS — R002 Palpitations: Secondary | ICD-10-CM | POA: Diagnosis not present

## 2016-02-25 DIAGNOSIS — R0789 Other chest pain: Secondary | ICD-10-CM | POA: Diagnosis not present

## 2016-02-25 DIAGNOSIS — K061 Gingival enlargement: Secondary | ICD-10-CM

## 2016-02-25 MED ORDER — METOPROLOL TARTRATE 25 MG PO TABS: 25.0000 mg | ORAL_TABLET | Freq: Two times a day (BID) | ORAL | Status: DC

## 2016-02-25 NOTE — Progress Notes (Signed)
Subjective:    Patient ID: Damon Butler, male    DOB: 1961/05/26, 55 y.o.   MRN: [id number] By signing my name below, I, Zola Button, attest that this documentation has been prepared under the direction and in the presence of Merri Ray, MD.  Electronically Signed: Zola Button, Medical Scribe. 02/25/2016. 2:36 PM.  HPI HPI Comments: Damon Butler is a 55 y.o. male who presents to the [facility] complaining of gum bleeding with swelling that he believes may be due to a medication side effect. 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   Hypertension: Normal home readings.  Results for orders placed or performed in visit on 11/08/15  COMPLETE METABOLIC PANEL WITH GFR  Result Value Ref Range   Sodium 136 135 - 146 mmol/L   Potassium 3.9 3.5 - 5.3 mmol/L   Chloride 100 98 - 110 mmol/L   CO2 30 20 - 31 mmol/L   Glucose, Bld 105 (H) 65 - 99 mg/dL   BUN 9 7 - 25 mg/dL   Creat 1.02 0.70 - 1.33 mg/dL   Total Bilirubin 0.6 0.2 - 1.2 mg/dL   Alkaline Phosphatase 61 40 - 115 U/L   AST 21 10 - 35 U/L   ALT 18 9 - 46 U/L   Total Protein 7.2 6.1 - 8.1 g/dL   Albumin 4.1 3.6 - 5.1 g/dL   Calcium 9.0 8.6 - 10.3 mg/dL   GFR, Est African American >89 >=60 mL/min   GFR, Est Non African American 84 >=60 mL/min  Lipid panel  Result Value Ref Range   Cholesterol 152 125 - 200 mg/dL   Triglycerides 53 <150 mg/dL   HDL 58 >=40 mg/dL   Total CHOL/HDL Ratio 2.6 <=5.0 Ratio   VLDL 11 <30 mg/dL   LDL Cholesterol 83 <130 mg/dL  TSH  Result Value Ref Range   TSH 0.695 0.350 - 4.500 uIU/mL    Palpitations: Patient has had palpitations intermittently, noticed more when feeling anxious. He reports feeling anxious 1-2 times a month on average. He does not have any palpitations when not feeling anxious. Patient denies feeling depressed. His last thyroid test was normal.  Chest pain: Patient  also reports having chest wall pain intermittently in the mornings since his knee surgery. He believes the pain is in the muscles. The pain improves throughout the day. Patient denies chest pain with exertion, SOB, nausea, and diaphoresis.  Blurred vision: Last visit with me November 2015. He still has intermittent blurry vision mostly in the mornings. Patient saw ophthalmology after his last visit with me over a year ago. He was told the blurred vision may be related to dry eyes or a medication side effect. He was given an eye drop, but he has not had significant improvement in the blurred vision. Patient denies dizziness.  Patient Active Problem List   Diagnosis Date Noted  . 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 OA (osteoarthritis) of knee 03/24/2014  . Other and unspecified hyperlipidemia 08/12/2013  . Acne 08/12/2013  . HTN (hypertension) 11/12/2012   Past Medical History  Diagnosis Date  . Allergy   . Heart murmur   . Anxiety   . Hypertension   . Sinus problem   . Arthritis     OA AND PAIN RT KNEE   Past Surgical History  Procedure Laterality Date  . Kel[facility]  surgery      [facility]  . Fracture surgery  1982    RT KNEE  . Total knee arthroplasty Right 03/24/2014    Procedure: RIGHT TOTAL KNEE ARTHROPLASTY WITH HARDWARE REMOVAL;  Surgeon: Gearlean Alf, MD;  Location: [facility];  Service: Orthopedics;  Laterality: Right;  . Knee arthroscopy Right 11/25/2014    Procedure: RIGHT ARTHROSCOPY KNEE/SYNOVECTOMY;  Surgeon: Gearlean Alf, MD;  Location: [facility];  Service: Orthopedics;  Laterality: Right;   Allergies  Allergen Reactions  . Lisinopril Swelling    LIPS    Prior to Admission medications   Medication Sig Start Date End Date Taking? Authorizing Provider  aspirin 81 MG tablet Take 81 mg by mouth daily after lunch.    Yes Historical Provider, MD  desloratadine (CLARINEX) 5 MG tablet Take 1 tablet (5 mg total) by mouth  daily. 11/08/15  Yes Sarah Alleen Borne, PA-C  Fish Oil-Cholecalciferol (FISH OIL + D3 PO) Take 1 capsule by mouth daily after lunch.    Yes Historical Provider, MD  fluticasone (FLONASE) 50 MCG/ACT nasal spray Place 2 sprays into both nostrils daily. 11/08/15  Yes Mancel Bale, PA-C  hydrochlorothiazide (MICROZIDE) 12.5 MG capsule Take 1 capsule (12.5 mg total) by mouth daily. 11/08/15  Yes Mancel Bale, PA-C  NIFEdipine (NIFEDICAL XL) 60 MG 24 hr tablet Take 1 tablet (60 mg total) by mouth daily. 11/08/15  Yes Mancel Bale, PA-C  Propylene Glycol (SYSTANE BALANCE OP) Place 1 drop into both eyes daily as needed (lubrication.).   Yes Historical Provider, MD   Social History   Social History  . Marital Status: Single    Spouse Name: N/A  . Number of Children: N/A  . Years of Education: college   Occupational History  . auto Dealer    Social History Main Topics  . Smoking status: Never Smoker   . Smokeless tobacco: Not on file  . Alcohol Use: 1.2 oz/week    2 Glasses of wine per week     Comment: all drinks-once a week 2 drinks  . Drug Use: No  . Sexual Activity: Yes    Birth Control/ Protection: Condom     Comment: sex partners in the last 12 months-2   Other Topics Concern  . Not on file   Social History Narrative     Review of Systems  Constitutional: Negative for diaphoresis.  Eyes: Positive for visual disturbance.  Respiratory: Negative for shortness of breath.   Cardiovascular: Positive for chest pain and palpitations.  Gastrointestinal: Negative for nausea.  Neurological: Negative for dizziness.  Psychiatric/Behavioral: Negative for dysphoric mood.       Objective:   Physical Exam  Constitutional: He is oriented to person, place, and time. He appears well-developed and well-nourished.  HENT:  Head: Normocephalic and atraumatic.  Prominent gingiva in his mouth.  Eyes: EOM are normal. Pupils are equal, round, and reactive to light.  Neck: No JVD present.  Carotid bruit is not present.  Cardiovascular: Normal rate, regular rhythm and normal heart sounds.   No murmur heard. Pulmonary/Chest: Effort normal and breath sounds normal. No respiratory distress. He has no wheezes. He has no rales.  Clear to auscultation bilaterally.   Musculoskeletal: He exhibits no edema.  Neurological: He is alert and oriented to person, place, and time.  Skin: Skin is warm and dry.  Psychiatric: He has a normal mood and affect.  Vitals reviewed.   Filed Vitals:  02/25/16 1353  BP: 134/90  Pulse: 108  Temp: 99 F (37.2 C)  TempSrc: Oral  Resp: 18  Height: 5' 11"  (1.803 m)  Weight: 177 lb 9.6 oz (80.559 kg)  SpO2: 98%         Assessment & Plan:   Damon Butler is a 55 y.o. male Palpitations - Plan: EKG 12-Lead, metoprolol tartrate (LOPRESSOR) 25 MG tablet  -Intermittent, associated with anxiety. We'll try metoprolol in place of nifedipine, this may be helpful. Follow-up in one month, discuss symptoms further at that time. RTC precautions if worse.  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 Stop nifedipine, change to metoprolol 25 mg twice a day, continue HCTZ 25 mg daily. Monitor outside blood pressures, discussed possibility of needing to adjust doses - Hypo and hypertension precautions discussed. Recheck in one month, sooner if labile readings.  Chest wall pain - Plan: EKG 12-Lead  -Reproducible pain. May be musculoskeletal in nature, no concerning history.  atypical and nonexertional. Recheck in one month. ER/911 chest pain precautions discussed.  Meds ordered this encounter  Medications  . metoprolol tartrate (LOPRESSOR) 25 MG tablet    Sig: Take 1 tablet (25 mg total) by mouth 2 (two) times daily.    Dispense:  60 tablet    Refill:  3   Patient Instructions  Stop nifedipine, start metoprol. Keep a record of your  blood pressures outside of the office and bring them to the next office visit in 1 month. If remaining over 140/90 - return sooner.  See information below on stress management and chest wall pain, and heart palpitations. If chest soreness worsens - be seen here or in ER.  Return to the clinic or go to the nearest emergency room if any of your symptoms worsen or new symptoms occur.  Palpitations A palpitation is the feeling that your heartbeat is irregular or is faster than normal. It may feel like your heart is fluttering or skipping a beat. Palpitations are usually not a serious problem. However, in some cases, you may need further medical evaluation. CAUSES  Palpitations can be caused by:  Smoking.  Caffeine or other stimulants, such as diet pills or energy drinks.  Alcohol.  Stress and anxiety.  Strenuous physical activity.  Fatigue.  Certain medicines.  Heart disease, especially if you have a history of irregular heart rhythms (arrhythmias), such as atrial fibrillation, atrial flutter, or supraventricular tachycardia.  An improperly working pacemaker or defibrillator. DIAGNOSIS  To find the cause of your palpitations, your health care provider will take your medical history and perform a physical exam. Your health care provider may also have you take a test called an ambulatory electrocardiogram (ECG). An ECG records your heartbeat patterns over a 24-hour period. You may also have other tests, such as:  Transthoracic echocardiogram (TTE). During echocardiography, sound waves are used to evaluate how blood flows through your heart.  Transesophageal echocardiogram (TEE).  Cardiac monitoring. This allows your health care provider to monitor your heart rate and rhythm in real time.  Holter monitor. This is a portable device that records your heartbeat and can help diagnose heart arrhythmias. It allows your health care provider to track your heart activity for several days, if  needed.  Stress tests by exercise or by giving medicine that makes the heart beat faster. TREATMENT  Treatment of palpitations depends on the cause of your symptoms and can vary greatly.  Most cases of palpitations do not require any treatment other than time, relaxation, and monitoring your symptoms. Other causes, such as atrial fibrillation, atrial flutter, or supraventricular tachycardia, usually require further treatment. HOME CARE INSTRUCTIONS   Avoid:  Caffeinated coffee, tea, soft drinks, diet pills, and energy drinks.  Chocolate.  Alcohol.  Stop smoking if you smoke.  Reduce your stress and anxiety. Things that can help you relax include:  A method of controlling things in your body, such as your heartbeats, with your mind (biofeedback).  Yoga.  Meditation.  Physical activity such as swimming, jogging, or walking.  Get plenty of rest and sleep. SEEK MEDICAL CARE IF:   You continue to have a fast or irregular heartbeat beyond 24 hours.  Your palpitations occur more often. SEEK IMMEDIATE MEDICAL CARE IF:  You have chest pain or shortness of breath.  You have a severe headache.  You feel dizzy or you faint. MAKE SURE YOU:  Understand these instructions.  Will watch your condition.  Will get help right away if you are not doing well or get worse.   This information is not intended to replace advice given to you by your health care provider. Make sure you discuss any questions you have with your health care provider.   Document Released: 12/02/2000 Document Revised: 12/10/2013 Document Reviewed: 02/03/2012 Elsevier Interactive Patient Education 2016 [place] and Stress Management Stress is a normal reaction to life events. It is what you feel when life demands more than you are used to or more than you can handle. Some stress can be useful. For example, the stress reaction can help you catch the last bus of the day, study for a test, or meet a  deadline at work. But stress that occurs too often or for too long can cause problems. It can affect your emotional health and interfere with relationships and normal daily activities. Too much stress can weaken your immune system and increase your risk for physical illness. If you already have a medical problem, stress can make it worse. CAUSES  All sorts of life events may cause stress. An event that causes stress for one person may not be stressful for another person. Major life events commonly cause stress. These may be positive or negative. Examples include losing your job, moving into a new home, getting married, having a baby, or losing a loved one. Less obvious life events may also cause stress, especially if they occur day after day or in combination. Examples include working long hours, driving in traffic, caring for children, being in debt, or being in a difficult relationship. SIGNS AND SYMPTOMS Stress may cause emotional symptoms including, the following:  Anxiety. This is feeling worried, afraid, on edge, overwhelmed, or out of control.  Anger. This is feeling irritated or impatient.  Depression. This is feeling sad, down, helpless, or guilty.  Difficulty focusing, remembering, or making decisions. Stress may cause physical symptoms, including the following:   Aches and pains. These may affect your head, neck, back, stomach, or other areas of your body.  Tight muscles or clenched jaw.  Low energy or trouble sleeping. Stress may cause unhealthy behaviors, including the following:   Eating to feel better (overeating) or skipping meals.  Sleeping too little, too much, or both.  Working too much or putting off tasks (procrastination).  Smoking, drinking alcohol, or using drugs to feel better. DIAGNOSIS  Stress is diagnosed through an assessment by your health care provider. Your health care provider  will ask questions about your symptoms and any stressful life events.Your  health care provider will also ask about your medical history and may order blood tests or other tests. Certain medical conditions and medicine can cause physical symptoms similar to stress. Mental illness can cause emotional symptoms and unhealthy behaviors similar to stress. Your health care provider may refer you to a mental health professional for further evaluation.  TREATMENT  Stress management is the recommended treatment for stress.The goals of stress management are reducing stressful life events and coping with stress in healthy ways.  Techniques for reducing stressful life events include the following:  Stress identification. Self-monitor for stress and identify what causes stress for you. These skills may help you to avoid some stressful events.  Time management. Set your priorities, keep a calendar of events, and learn to say "no." These tools can help you avoid making too many commitments. Techniques for coping with stress include the following:  Rethinking the problem. Try to think realistically about stressful events rather than ignoring them or overreacting. Try to find the positives in a stressful situation rather than focusing on the negatives.  Exercise. Physical exercise can release both physical and emotional tension. The key is to find a form of exercise you enjoy and do it regularly.  Relaxation techniques. These relax the body and mind. Examples include yoga, meditation, tai chi, biofeedback, deep breathing, progressive muscle relaxation, listening to music, being out in nature, journaling, and other hobbies. Again, the key is to find one or more that you enjoy and can do regularly.  Healthy lifestyle. Eat a balanced diet, get plenty of sleep, and do not smoke. Avoid using alcohol or drugs to relax.  Strong support network. Spend time with family, friends, or other people you enjoy being around.Express your feelings and talk things over with someone you trust. Counseling  or talktherapy with a mental health professional may be helpful if you are having difficulty managing stress on your own. Medicine is typically not recommended for the treatment of stress.Talk to your health care provider if you think you need medicine for symptoms of stress. HOME CARE INSTRUCTIONS  Keep all follow-up visits as directed by your health care provider.  Take all medicines as directed by your health care provider. SEEK MEDICAL CARE IF:  Your symptoms get worse or you start having new symptoms.  You feel overwhelmed by your problems and can no longer manage them on your own. SEEK IMMEDIATE MEDICAL CARE IF:  You feel like hurting yourself or someone else.   This information is not intended to replace advice given to you by your health care provider. Make sure you discuss any questions you have with your health care provider.   Document Released: 05/31/2001 Document Revised: 12/26/2014 Document Reviewed: 07/30/2013 Elsevier Interactive Patient Education 2016 [employer]  Chest Wall Pain Chest wall pain is pain in or around the bones and muscles of your chest. Sometimes, an injury causes this pain. Sometimes, the cause may not be known. This pain may take several weeks or longer to get better. HOME CARE INSTRUCTIONS  Pay attention to any changes in your symptoms. Take these actions to help with your pain:   Rest as told by your health care provider.   Avoid activities that cause pain. These include any activities that use your chest muscles or your abdominal and side muscles to lift heavy items.   If directed, apply ice to the painful area:  Put ice in a plastic bag.  Place a towel between your skin and the bag.  Leave the ice on for 20 minutes, 2-3 times per day.  Take over-the-counter and prescription medicines only as told by your health care provider.  Do not use tobacco products, including cigarettes, chewing tobacco, and e-cigarettes. If you need help  quitting, ask your health care provider.  Keep all follow-up visits as told by your health care provider. This is important. SEEK MEDICAL CARE IF:  You have a fever.  Your chest pain becomes worse.  You have new symptoms. SEEK IMMEDIATE MEDICAL CARE IF:  You have nausea or vomiting.  You feel sweaty or light-headed.  You have a cough with phlegm (sputum) or you cough up blood.  You develop shortness of breath.   This information is not intended to replace advice given to you by your health care provider. Make sure you discuss any questions you have with your health care provider.   Document Released: 12/05/2005 Document Revised: 08/26/2015 Document Reviewed: 03/02/2015 Elsevier Interactive Patient Education [employer].     I personally performed the services described in this documentation, which was scribed in my presence. The recorded information has been reviewed and considered, and addended by me as needed.

## 2016-02-25 NOTE — Patient Instructions (Addendum)
Stop nifedipine, start metoprol. Keep a record of your blood pressures outside of the office and bring them to the next office visit in 1 month. If remaining over 140/90 - return sooner.  See information below on stress management and chest wall pain, and heart palpitations. If chest soreness worsens - be seen here or in ER.  Return to the clinic or go to the nearest emergency room if any of your symptoms worsen or new symptoms occur.  Palpitations A palpitation is the feeling that your heartbeat is irregular or is faster than normal. It may feel like your heart is fluttering or skipping a beat. Palpitations are usually not a serious problem. However, in some cases, you may need further medical evaluation. CAUSES  Palpitations can be caused by:  Smoking.  Caffeine or other stimulants, such as diet pills or energy drinks.  Alcohol.  Stress and anxiety.  Strenuous physical activity.  Fatigue.  Certain medicines.  Heart disease, especially if you have a history of irregular heart rhythms (arrhythmias), such as atrial fibrillation, atrial flutter, or supraventricular tachycardia.  An improperly working pacemaker or defibrillator. DIAGNOSIS  To find the cause of your palpitations, your health care provider will take your medical history and perform a physical exam. Your health care provider may also have you take a test called an ambulatory electrocardiogram (ECG). An ECG records your heartbeat patterns over a 24-hour period. You may also have other tests, such as:  Transthoracic echocardiogram (TTE). During echocardiography, sound waves are used to evaluate how blood flows through your heart.  Transesophageal echocardiogram (TEE).  Cardiac monitoring. This allows your health care provider to monitor your heart rate and rhythm in real time.  Holter monitor. This is a portable device that records your heartbeat and can help diagnose heart arrhythmias. It allows your health care provider  to track your heart activity for several days, if needed.  Stress tests by exercise or by giving medicine that makes the heart beat faster. TREATMENT  Treatment of palpitations depends on the cause of your symptoms and can vary greatly. Most cases of palpitations do not require any treatment other than time, relaxation, and monitoring your symptoms. Other causes, such as atrial fibrillation, atrial flutter, or supraventricular tachycardia, usually require further treatment. HOME CARE INSTRUCTIONS   Avoid:  Caffeinated coffee, tea, soft drinks, diet pills, and energy drinks.  Chocolate.  Alcohol.  Stop smoking if you smoke.  Reduce your stress and anxiety. Things that can help you relax include:  A method of controlling things in your body, such as your heartbeats, with your mind (biofeedback).  Yoga.  Meditation.  Physical activity such as swimming, jogging, or walking.  Get plenty of rest and sleep. SEEK MEDICAL CARE IF:   You continue to have a fast or irregular heartbeat beyond 24 hours.  Your palpitations occur more often. SEEK IMMEDIATE MEDICAL CARE IF:  You have chest pain or shortness of breath.  You have a severe headache.  You feel dizzy or you faint. MAKE SURE YOU:  Understand these instructions.  Will watch your condition.  Will get help right away if you are not doing well or get worse.   This information is not intended to replace advice given to you by your health care provider. Make sure you discuss any questions you have with your health care provider.   Document Released: 12/02/2000 Document Revised: 12/10/2013 Document Reviewed: 02/03/2012 Elsevier Interactive Patient Education 2016 [place] and Stress Management Stress is a  normal reaction to life events. It is what you feel when life demands more than you are used to or more than you can handle. Some stress can be useful. For example, the stress reaction can help you catch the  last bus of the day, study for a test, or meet a deadline at work. But stress that occurs too often or for too long can cause problems. It can affect your emotional health and interfere with relationships and normal daily activities. Too much stress can weaken your immune system and increase your risk for physical illness. If you already have a medical problem, stress can make it worse. CAUSES  All sorts of life events may cause stress. An event that causes stress for one person may not be stressful for another person. Major life events commonly cause stress. These may be positive or negative. Examples include losing your job, moving into a new home, getting married, having a baby, or losing a loved one. Less obvious life events may also cause stress, especially if they occur day after day or in combination. Examples include working long hours, driving in traffic, caring for children, being in debt, or being in a difficult relationship. SIGNS AND SYMPTOMS Stress may cause emotional symptoms including, the following:  Anxiety. This is feeling worried, afraid, on edge, overwhelmed, or out of control.  Anger. This is feeling irritated or impatient.  Depression. This is feeling sad, down, helpless, or guilty.  Difficulty focusing, remembering, or making decisions. Stress may cause physical symptoms, including the following:   Aches and pains. These may affect your head, neck, back, stomach, or other areas of your body.  Tight muscles or clenched jaw.  Low energy or trouble sleeping. Stress may cause unhealthy behaviors, including the following:   Eating to feel better (overeating) or skipping meals.  Sleeping too little, too much, or both.  Working too much or putting off tasks (procrastination).  Smoking, drinking alcohol, or using drugs to feel better. DIAGNOSIS  Stress is diagnosed through an assessment by your health care provider. Your health care provider will ask questions about  your symptoms and any stressful life events.Your health care provider will also ask about your medical history and may order blood tests or other tests. Certain medical conditions and medicine can cause physical symptoms similar to stress. Mental illness can cause emotional symptoms and unhealthy behaviors similar to stress. Your health care provider may refer you to a mental health professional for further evaluation.  TREATMENT  Stress management is the recommended treatment for stress.The goals of stress management are reducing stressful life events and coping with stress in healthy ways.  Techniques for reducing stressful life events include the following:  Stress identification. Self-monitor for stress and identify what causes stress for you. These skills may help you to avoid some stressful events.  Time management. Set your priorities, keep a calendar of events, and learn to say "no." These tools can help you avoid making too many commitments. Techniques for coping with stress include the following:  Rethinking the problem. Try to think realistically about stressful events rather than ignoring them or overreacting. Try to find the positives in a stressful situation rather than focusing on the negatives.  Exercise. Physical exercise can release both physical and emotional tension. The key is to find a form of exercise you enjoy and do it regularly.  Relaxation techniques. These relax the body and mind. Examples include yoga, meditation, tai chi, biofeedback, deep breathing, progressive muscle relaxation, listening to  music, being out in nature, journaling, and other hobbies. Again, the key is to find one or more that you enjoy and can do regularly.  Healthy lifestyle. Eat a balanced diet, get plenty of sleep, and do not smoke. Avoid using alcohol or drugs to relax.  Strong support network. Spend time with family, friends, or other people you enjoy being around.Express your feelings and  talk things over with someone you trust. Counseling or talktherapy with a mental health professional may be helpful if you are having difficulty managing stress on your own. Medicine is typically not recommended for the treatment of stress.Talk to your health care provider if you think you need medicine for symptoms of stress. HOME CARE INSTRUCTIONS  Keep all follow-up visits as directed by your health care provider.  Take all medicines as directed by your health care provider. SEEK MEDICAL CARE IF:  Your symptoms get worse or you start having new symptoms.  You feel overwhelmed by your problems and can no longer manage them on your own. SEEK IMMEDIATE MEDICAL CARE IF:  You feel like hurting yourself or someone else.   This information is not intended to replace advice given to you by your health care provider. Make sure you discuss any questions you have with your health care provider.   Document Released: 05/31/2001 Document Revised: 12/26/2014 Document Reviewed: 07/30/2013 Elsevier Interactive Patient Education 2016 [employer]  Chest Wall Pain Chest wall pain is pain in or around the bones and muscles of your chest. Sometimes, an injury causes this pain. Sometimes, the cause may not be known. This pain may take several weeks or longer to get better. HOME CARE INSTRUCTIONS  Pay attention to any changes in your symptoms. Take these actions to help with your pain:   Rest as told by your health care provider.   Avoid activities that cause pain. These include any activities that use your chest muscles or your abdominal and side muscles to lift heavy items.   If directed, apply ice to the painful area:  Put ice in a plastic bag.  Place a towel between your skin and the bag.  Leave the ice on for 20 minutes, 2-3 times per day.  Take over-the-counter and prescription medicines only as told by your health care provider.  Do not use tobacco products, including cigarettes,  chewing tobacco, and e-cigarettes. If you need help quitting, ask your health care provider.  Keep all follow-up visits as told by your health care provider. This is important. SEEK MEDICAL CARE IF:  You have a fever.  Your chest pain becomes worse.  You have new symptoms. SEEK IMMEDIATE MEDICAL CARE IF:  You have nausea or vomiting.  You feel sweaty or light-headed.  You have a cough with phlegm (sputum) or you cough up blood.  You develop shortness of breath.   This information is not intended to replace advice given to you by your health care provider. Make sure you discuss any questions you have with your health care provider.   Document Released: 12/05/2005 Document Revised: 08/26/2015 Document Reviewed: 03/02/2015 Elsevier Interactive Patient Education [employer].

## 2016-05-06 ENCOUNTER — Other Ambulatory Visit: Payer: Self-pay | Admitting: Physician Assistant

## 2016-05-09 ENCOUNTER — Encounter: Payer: 59 | Admitting: Family Medicine

## 2016-05-11 ENCOUNTER — Ambulatory Visit (INDEPENDENT_AMBULATORY_CARE_PROVIDER_SITE_OTHER): Payer: BLUE CROSS/BLUE SHIELD | Admitting: Family Medicine

## 2016-05-11 ENCOUNTER — Encounter: Payer: Self-pay | Admitting: Family Medicine

## 2016-05-11 VITALS — BP 144/96 | HR 95 | Temp 98.5°F | Resp 16 | Ht 70.0 in | Wt 178.6 lb

## 2016-05-11 DIAGNOSIS — I10 Essential (primary) hypertension: Secondary | ICD-10-CM

## 2016-05-11 DIAGNOSIS — Z125 Encounter for screening for malignant neoplasm of prostate: Secondary | ICD-10-CM | POA: Diagnosis not present

## 2016-05-11 DIAGNOSIS — Z0000 Encounter for general adult medical examination without abnormal findings: Secondary | ICD-10-CM

## 2016-05-11 DIAGNOSIS — J309 Allergic rhinitis, unspecified: Secondary | ICD-10-CM

## 2016-05-11 DIAGNOSIS — Z1159 Encounter for screening for other viral diseases: Secondary | ICD-10-CM

## 2016-05-11 DIAGNOSIS — Z114 Encounter for screening for human immunodeficiency virus [HIV]: Secondary | ICD-10-CM

## 2016-05-11 DIAGNOSIS — Z13220 Encounter for screening for lipoid disorders: Secondary | ICD-10-CM | POA: Diagnosis not present

## 2016-05-11 LAB — LIPID PANEL
CHOL/HDL RATIO: 2.9 ratio (ref <=5.0)
CHOLESTEROL: 172 mg/dL (ref 125–200)
HDL: 60 mg/dL (ref >=40)
LDL CALC: 99 mg/dL (ref <130)
Triglycerides: 67 mg/dL (ref <150)
VLDL: 13 mg/dL (ref <30)

## 2016-05-11 LAB — COMPLETE METABOLIC PANEL WITH GFR
ALBUMIN: 4.2 g/dL (ref 3.6–5.1)
ALT: 20 U/L (ref 9–46)
AST: 22 U/L (ref 10–35)
Alkaline Phosphatase: 49 U/L (ref 40–115)
BUN: 15 mg/dL (ref 7–25)
CALCIUM: 9.1 mg/dL (ref 8.6–10.3)
CO2: 24 mmol/L (ref 20–31)
Chloride: 101 mmol/L (ref 98–110)
Creat: 0.95 mg/dL (ref 0.70–1.33)
GFR, Est African American: >89 mL/min (ref >=60)
GLUCOSE: 98 mg/dL (ref 65–99)
POTASSIUM: 3.8 mmol/L (ref 3.5–5.3)
SODIUM: 137 mmol/L (ref 135–146)
Total Bilirubin: 0.9 mg/dL (ref 0.2–1.2)
Total Protein: 7.5 g/dL (ref 6.1–8.1)

## 2016-05-11 LAB — HIV ANTIBODY (ROUTINE TESTING W REFLEX): HIV 1&2 Ab, 4th Generation: NONREACTIVE

## 2016-05-11 MED ORDER — HYDROCHLOROTHIAZIDE 12.5 MG PO CAPS: ORAL_CAPSULE | ORAL | Status: DC

## 2016-05-11 MED ORDER — METOPROLOL SUCCINATE ER 50 MG PO TB24: 50.0000 mg | ORAL_TABLET | Freq: Every day | ORAL | Status: DC

## 2016-05-11 NOTE — Patient Instructions (Addendum)
IF you received an x-ray today, you will receive an invoice from [facility]. Please contact [facility] at [phone] with questions or concerns regarding your invoice.   IF you received labwork today, you will receive an invoice from [employer]. Please contact [facility] at [phone] with questions or concerns regarding your invoice.   Our billing staff will not be able to assist you with questions regarding bills from these companies.  You will be contacted with the lab results as soon as they are available. The fastest way to get your results is to activate your My Chart account. Instructions are located on the last page of this paperwork. If you have not heard from [place] regarding the results in 2 weeks, please contact this office.    Check your pharmacy about the clarinex. If this is not covered, then you can take over the counter allegra, zyrtec or claritin.  Keep a record of your blood pressures outside of the office and if over 140/90 - return to discuss medications further.  If any dizziness or heart palpitations at once daily dosing of Toprol, let me know.  Exercise as below - this can also help with stress management.  You may be due for colonoscopy next year. Call Dr. Haywood Pao office to see when you need to schedule this. [phone]   Keeping you healthy  Get these tests  Blood pressure- Have your blood pressure checked once a year by your healthcare provider.  Normal blood pressure is 120/80  Weight- Have your body mass index (BMI) calculated to screen for obesity.  BMI is a measure of body fat based on height and weight. You can also calculate your own BMI at [email or web address].  Cholesterol- Have your cholesterol checked every year.  Diabetes- Have your blood sugar checked regularly if you have high blood pressure, high cholesterol, have a family history of diabetes or if you are overweight.  Screening for Colon  Cancer- Colonoscopy starting at age 22.  Screening may begin sooner depending on your family history and other health conditions. Follow up colonoscopy as directed by your Gastroenterologist.  Screening for Prostate Cancer- Both blood work (PSA) and a rectal exam help screen for Prostate Cancer.  Screening begins at age 4 with African-American men and at age 87 with Caucasian men.  Screening may begin sooner depending on your family history.  Take these medicines  Aspirin- One aspirin daily can help prevent Heart disease and Stroke.  Flu shot- Every fall.  Tetanus- Every 10 years.  Zostavax- Once after the age of 24 to prevent Shingles.  Pneumonia shot- Once after the age of 28; if you are younger than 70, ask your healthcare provider if you need a Pneumonia shot.  Take these steps  Don't smoke- If you do smoke, talk to your doctor about quitting.  For tips on how to quit, go to www.smokefree.gov or call 1-800-QUIT-NOW.  Be physically active- Exercise 5 days a week for at least 30 minutes.  If you are not already physically active start slow and gradually work up to 30 minutes of moderate physical activity.  Examples of moderate activity include walking briskly, mowing the yard, dancing, swimming, bicycling, etc.  Eat a healthy diet- Eat a variety of healthy food such as fruits, vegetables, low fat milk, low fat cheese, yogurt, lean meant, poultry, fish, beans, tofu, etc. For more information go to www.thenutritionsource.org  Drink alcohol in moderation- Limit alcohol intake to less than two drinks a day.  Never drink and drive.  Dentist- Brush and floss twice daily; visit your dentist twice a year.  Depression- Your emotional health is as important as your physical health. If you're feeling down, or losing interest in things you would normally enjoy please talk to your healthcare provider.  Eye exam- Visit your eye doctor every year.  Safe sex- If you may be exposed to a sexually  transmitted infection, use a condom.  Seat belts- Seat belts can save your life; always wear one.  Smoke/Carbon Monoxide detectors- These detectors need to be installed on the appropriate level of your home.  Replace batteries at least once a year.  Skin cancer- When out in the sun, cover up and use sunscreen 15 SPF or higher.  Violence- If anyone is threatening you, please tell your healthcare provider.  Living Will/ Health care power of attorney- Speak with your healthcare provider and family.

## 2016-05-11 NOTE — Progress Notes (Signed)
By signing my name below, I, Mesha Guinyard, attest that this documentation has been prepared under the direction and in the presence of Meredith Staggers, MD.  Electronically Signed: Arvilla Market, Medical Scribe. 05/11/2016. 2:00 PM. Subjective:    Patient ID: Damon Butler, male    DOB: Nov 17, 1961, 55 y.o.   MRN: [id number]  HPI Chief Complaint  Patient presents with  . Annual Exam  . Medication Refill    Clarinex 5 mg, HCTZ Microzide 12.5 mg, Metoprolol Tartrate(tab) Lopressor 25 mg    HPI Comments: Damon Butler is a 55 y.o. male with a PMHx of DM, and HTN who presents to the [facility] for his annual physical exam. Pt reports he wants to talk about his allergies. Pt denies eating anything this morning.  CA Screening: Prostate CA Screening: Last prostate test was 2 years ago. Pt would like to have a digital rectal exam and blood work for his PSA. Lab Results  Component Value Date   PSA 0.58 10/20/2014   PSA 0.47 11/12/2012  Colon CA Screening: Apparetly was UTD with his colonoscopy at his Nov 2015 PE. Pt states that his last colonoscopy was 2008 by the doctor on [address] was 4 years ago because of a small amount of blood in his rectum, and his granddad had PMHx of colon CA.  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 He was started on metoperol 25 mg BID. He's on ASA QD, as well as HCTZ 12.5 mg QD. Pt mentions his bp have been doing great until today. His bp have been ranging from 126-130/70-80 at home. Pt mentions his bp have been better at the dentist. 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 Pt mentions metoprol makes him feel dizzy or "drunk". Pt denies chest pain or heart palpitation while on metoprol. Lab Results  Component Value Date   CREATININE 1.02 11/08/2015   Heart Palpitation: Thought to be due to stress/anxiety last visit. He had a EKG at that visit. He was  also treated for chest wall pain. Pt states that his heart palpitations have been resolved.  Allergic Rhinitis: Takes flonase when he can see the pollen outside. Pt mentions running out of clarinex about a year ago and needs a refill. Pt didn't realize he had a 90 day supplies.  Exercise: Pt reports he been getting in exercise through stretches in the morning.   Depression: Depression screen [facility] 2/9 05/11/2016 02/25/2016 11/08/2015 10/20/2014 08/11/2014  Decreased Interest 0 0 0 0 1  Down, Depressed, Hopeless 0 0 0 0 0  PHQ - 2 Score 0 0 0 0 1   Vision: Pt has seen an eye doctor 2 years ago.  Visual Acuity Screening   Right eye Left eye Both eyes  Without correction: 20/15 20/15 20/15   With correction:      Dentist: Pt reports seeing the dentist every 6 months.  Advance Directives: Pt was given paperwork on advance directives.  Immunizations: Pt mentions he's never taken the flu shot. Immunization History  Administered Date(s) Administered  . Tdap 02/25/2013   HIV/Hep C Screening: Pt is okay with getting Hep C on his results. Pt mentions it's been a while since he's tested for HIV.  Patient Active Problem List   Diagnosis Date Noted  . 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 OA (osteoarthritis) of knee 03/24/2014  . Other and unspecified hyperlipidemia 08/12/2013  .  Acne 08/12/2013  . HTN (hypertension) 11/12/2012   Past Medical History  Diagnosis Date  . Allergy   . Heart murmur   . Anxiety   . Hypertension   . Sinus problem   . Arthritis     OA AND PAIN RT KNEE   Past Surgical History  Procedure Laterality Date  . Keloid surgery      BACK OF NECK  1986 & 1988  . Fracture surgery  1982    RT KNEE  . Total knee arthroplasty Right 03/24/2014    Procedure: RIGHT TOTAL KNEE ARTHROPLASTY WITH HARDWARE REMOVAL;  Surgeon: Loanne DrillingFrank V Aluisio, MD;  Location: [facility];  Service: Orthopedics;  Laterality: Right;  . Knee arthroscopy Right  11/25/2014    Procedure: RIGHT ARTHROSCOPY KNEE/SYNOVECTOMY;  Surgeon: Loanne DrillingFrank Aluisio V, MD;  Location: [facility];  Service: Orthopedics;  Laterality: Right;   Allergies  Allergen Reactions  . Lisinopril Swelling    LIPS   . Nifedipine Other (See Comments)    Gingival hyperplasia.    Prior to Admission medications   Medication Sig Start Date End Date Taking? Authorizing Provider  aspirin 81 MG tablet Take 81 mg by mouth daily after lunch.     Historical Provider, MD  desloratadine (CLARINEX) 5 MG tablet Take 1 tablet (5 mg total) by mouth daily. 11/08/15   Morrell RiddleSarah L Weber, PA-C  Fish Oil-Cholecalciferol (FISH OIL + D3 PO) Take 1 capsule by mouth daily after lunch.     Historical Provider, MD  fluticasone (FLONASE) 50 MCG/ACT nasal spray Place 2 sprays into both nostrils daily. 11/08/15   Morrell RiddleSarah L Weber, PA-C  hydrochlorothiazide (MICROZIDE) 12.5 MG capsule TAKE 1 CAPSULE(12.5 MG) BY MOUTH DAILY 05/06/16   Morrell RiddleSarah L Weber, PA-C  metoprolol tartrate (LOPRESSOR) 25 MG tablet Take 1 tablet (25 mg total) by mouth 2 (two) times daily. 02/25/16   Shade FloodJeffrey R Loveda Colaizzi, MD  Propylene Glycol (SYSTANE BALANCE OP) Place 1 drop into both eyes daily as needed (lubrication.).    Historical Provider, MD   Social History   Social History  . Marital Status: Single    Spouse Name: N/A  . Number of Children: N/A  . Years of Education: college   Occupational History  . auto [employer]    Social History Main Topics  . Smoking status: Never Smoker   . Smokeless tobacco: Not on file  . Alcohol Use: 1.2 oz/week    2 Glasses of wine per week     Comment: all drinks-once a week 2 drinks  . Drug Use: No  . Sexual Activity: Yes    Birth Control/ Protection: Condom     Comment: sex partners in the last 12 months-2   Other Topics Concern  . Not on file   Social History Narrative   Review of Systems  HENT: Positive for rhinorrhea.   Cardiovascular: Negative for chest pain and palpitations.  Allergic/Immunologic:  Positive for environmental allergies.  Neurological: Positive for dizziness.    Objective:  BP 144/96 mmHg  Pulse 95  Temp(Src) 98.5 F (36.9 C) (Oral)  Resp 16  Ht 5\' 10"  (1.778 m)  Wt 178 lb 9.6 oz (81.012 kg)  BMI 25.63 kg/m2  SpO2 98%   Physical Exam  Constitutional: He is oriented to person, place, and time. He appears well-developed and well-nourished.  HENT:  Head: Normocephalic and atraumatic.  Right Ear: External ear normal.  Left Ear: External ear normal.  Mouth/Throat: Oropharynx is clear and moist.  Eyes: Conjunctivae and EOM  are normal. Pupils are equal, round, and reactive to light.  Neck: Normal range of motion. Neck supple. No thyromegaly present.  Cardiovascular: Normal rate, regular rhythm, normal heart sounds and intact distal pulses.   Pulmonary/Chest: Effort normal and breath sounds normal. No respiratory distress. He has no wheezes.  Abdominal: Soft. He exhibits no distension. There is no tenderness. Hernia confirmed negative in the right inguinal area and confirmed negative in the left inguinal area.  Genitourinary: Prostate normal.  Musculoskeletal: Normal range of motion. He exhibits no edema or tenderness.  Lymphadenopathy:    He has no cervical adenopathy.  Neurological: He is alert and oriented to person, place, and time. He has normal reflexes.  Skin: Skin is warm and dry.  Psychiatric: He has a normal mood and affect. His behavior is normal.  Vitals reviewed.   Assessment & Plan:   Damon Butler is a 55 y.o. male Annual physical exam  -anticipatory guidance as below in AVS, screening labs above. Health maintenance items as above in HPI discussed/recommended as applicable.   Essential hypertension - Plan: metoprolol succinate (TOPROL-XL) 50 MG 24 hr tablet, hydrochlorothiazide (MICROZIDE) 12.5 MG capsule, COMPLETE METABOLIC PANEL WITH GFR, Lipid panel  -Borderline, check home readings, changed Toprol to once daily dosing, RTC precautions.  Labs pending.  Allergic rhinitis, unspecified allergic rhinitis type  - Should have Clarinex prescription available at pharmacy, otherwise can use over-the-counter Allegra, Claritin, or Zyrtec.  Screening for HIV (human immunodeficiency virus) - Plan: HIV antibody  Need for hepatitis C screening test - Plan: Hepatitis C antibody  Screening for hyperlipidemia - Plan: COMPLETE METABOLIC PANEL WITH GFR, Lipid panel  Screening for prostate cancer - Plan: PSA  We discussed pros and cons of prostate cancer screening, and after this discussion, he chose to have screening done. PSA obtained, and no concerning findings on DRE.    Meds ordered this encounter  Medications  . metoprolol succinate (TOPROL-XL) 50 MG 24 hr tablet    Sig: Take 1 tablet (50 mg total) by mouth daily. Take with or immediately following a meal.    Dispense:  90 tablet    Refill:  1  . hydrochlorothiazide (MICROZIDE) 12.5 MG capsule    Sig: TAKE 1 CAPSULE(12.5 MG) BY MOUTH DAILY    Dispense:  90 capsule    Refill:  1   Patient Instructions       IF you received an x-ray today, you will receive an invoice from [facility]. Please contact [facility] at [phone] with questions or concerns regarding your invoice.   IF you received labwork today, you will receive an invoice from [employer]. Please contact [facility] at [phone] with questions or concerns regarding your invoice.   Our billing staff will not be able to assist you with questions regarding bills from these companies.  You will be contacted with the lab results as soon as they are available. The fastest way to get your results is to activate your My Chart account. Instructions are located on the last page of this paperwork. If you have not heard from [place] regarding the results in 2 weeks, please contact this office.    Check your pharmacy about the clarinex. If this is not covered, then you can take over  the counter allegra, zyrtec or claritin.  Keep a record of your blood pressures outside of the office and if over 140/90 - return to discuss medications further.  If any dizziness or heart palpitations at once daily  dosing of Toprol, let me know.  Exercise as below - this can also help with stress management.  You may be due for colonoscopy next year. Call Dr. Haywood Pao office to see when you need to schedule this. [phone]   Keeping you healthy  Get these tests  Blood pressure- Have your blood pressure checked once a year by your healthcare provider.  Normal blood pressure is 120/80  Weight- Have your body mass index (BMI) calculated to screen for obesity.  BMI is a measure of body fat based on height and weight. You can also calculate your own BMI at [email or web address].  Cholesterol- Have your cholesterol checked every year.  Diabetes- Have your blood sugar checked regularly if you have high blood pressure, high cholesterol, have a family history of diabetes or if you are overweight.  Screening for Colon Cancer- Colonoscopy starting at age 36.  Screening may begin sooner depending on your family history and other health conditions. Follow up colonoscopy as directed by your Gastroenterologist.  Screening for Prostate Cancer- Both blood work (PSA) and a rectal exam help screen for Prostate Cancer.  Screening begins at age 3 with African-American men and at age 11 with Caucasian men.  Screening may begin sooner depending on your family history.  Take these medicines  Aspirin- One aspirin daily can help prevent Heart disease and Stroke.  Flu shot- Every fall.  Tetanus- Every 10 years.  Zostavax- Once after the age of 74 to prevent Shingles.  Pneumonia shot- Once after the age of 39; if you are younger than 46, ask your healthcare provider if you need a Pneumonia shot.  Take these steps  Don't smoke- If you do smoke, talk to your doctor about quitting.  For tips on how to  quit, go to www.smokefree.gov or call 1-800-QUIT-NOW.  Be physically active- Exercise 5 days a week for at least 30 minutes.  If you are not already physically active start slow and gradually work up to 30 minutes of moderate physical activity.  Examples of moderate activity include walking briskly, mowing the yard, dancing, swimming, bicycling, etc.  Eat a healthy diet- Eat a variety of healthy food such as fruits, vegetables, low fat milk, low fat cheese, yogurt, lean meant, poultry, fish, beans, tofu, etc. For more information go to www.thenutritionsource.org  Drink alcohol in moderation- Limit alcohol intake to less than two drinks a day. Never drink and drive.  Dentist- Brush and floss twice daily; visit your dentist twice a year.  Depression- Your emotional health is as important as your physical health. If you're feeling down, or losing interest in things you would normally enjoy please talk to your healthcare provider.  Eye exam- Visit your eye doctor every year.  Safe sex- If you may be exposed to a sexually transmitted infection, use a condom.  Seat belts- Seat belts can save your life; always wear one.  Smoke/Carbon Monoxide detectors- These detectors need to be installed on the appropriate level of your home.  Replace batteries at least once a year.  Skin cancer- When out in the sun, cover up and use sunscreen 15 SPF or higher.  Violence- If anyone is threatening you, please tell your healthcare provider.  Living Will/ Health care power of attorney- Speak with your healthcare provider and family.    I personally performed the services described in this documentation, which was scribed in my presence. The recorded information has been reviewed and considered, and addended by me as needed.

## 2016-05-12 LAB — HEPATITIS C ANTIBODY: HCV AB: NEGATIVE

## 2016-05-12 LAB — PSA: PSA: 2.29 ng/mL (ref <=4.00)

## 2016-10-14 ENCOUNTER — Other Ambulatory Visit: Payer: Self-pay | Admitting: Family Medicine

## 2016-10-14 ENCOUNTER — Ambulatory Visit (INDEPENDENT_AMBULATORY_CARE_PROVIDER_SITE_OTHER): Payer: BLUE CROSS/BLUE SHIELD | Admitting: Family Medicine

## 2016-10-14 DIAGNOSIS — Z9189 Other specified personal risk factors, not elsewhere classified: Secondary | ICD-10-CM

## 2016-10-14 DIAGNOSIS — R972 Elevated prostate specific antigen [PSA]: Secondary | ICD-10-CM | POA: Insufficient documentation

## 2016-10-14 NOTE — Progress Notes (Signed)
Damon Butler is a 55 y.o. male who presents to [facility] today for follow-up elevated PSA. Patient presents to clinic today to recheck PSA. He was seen in May where his PSA had increased from 0.5 to about 2.5. He is currently symptomatic with no urinary frequency or urgency or dysuria. He has a follow-up appointment with his PCP in November.   Past Medical History:  Diagnosis Date  . Allergy   . Anxiety   . Arthritis    OA AND PAIN RT KNEE  . Heart murmur   . Hypertension   . Sinus problem    Past Surgical History:  Procedure Laterality Date  . FRACTURE SURGERY  1982   RT KNEE  . JOINT REPLACEMENT    . KELOID SURGERY     BACK OF NECK  1986 & 1988  . KNEE ARTHROSCOPY Right 11/25/2014   Procedure: RIGHT ARTHROSCOPY KNEE/SYNOVECTOMY;  Surgeon: Loanne Drilling, MD;  Location: [facility];  Service: Orthopedics;  Laterality: Right;  . TOTAL KNEE ARTHROPLASTY Right 03/24/2014   Procedure: RIGHT TOTAL KNEE ARTHROPLASTY WITH HARDWARE REMOVAL;  Surgeon: Loanne Drilling, MD;  Location: [facility];  Service: Orthopedics;  Laterality: Right;   Social History  Substance Use Topics  . Smoking status: Never Smoker  . Smokeless tobacco: Never Used  . Alcohol use 1.2 oz/week    2 Glasses of wine per week     Comment: all drinks-once a week 2 drinks   ROS as above Medications: Current Outpatient Prescriptions  Medication Sig Dispense Refill  . aspirin 81 MG tablet Take 81 mg by mouth daily after lunch.     . desloratadine (CLARINEX) 5 MG tablet Take 1 tablet (5 mg total) by mouth daily. 90 tablet 4  . Fish Oil-Cholecalciferol (FISH OIL + D3 PO) Take 1 capsule by mouth daily after lunch.     . fluticasone (FLONASE) 50 MCG/ACT nasal spray Place 2 sprays into both nostrils daily. 16 g 12  . hydrochlorothiazide (MICROZIDE) 12.5 MG capsule TAKE 1 CAPSULE(12.5 MG) BY MOUTH DAILY 90 capsule 1  . metoprolol succinate (TOPROL-XL) 50 MG 24 hr tablet Take 1 tablet (50 mg total) by mouth daily. Take  with or immediately following a meal. 90 tablet 1  . Propylene Glycol (SYSTANE BALANCE OP) Place 1 drop into both eyes daily as needed (lubrication.).     No current facility-administered medications for this visit.    Allergies  Allergen Reactions  . Lisinopril Swelling    LIPS   . Nifedipine Other (See Comments)    Gingival hyperplasia.      Exam:  BP 120/70   Pulse 72   Temp 99.1 F (37.3 C) (Oral)   Resp 18   Ht 5\' 10"  (1.778 m)   Wt 184 lb 3.2 oz (83.6 kg)   SpO2 97%   BMI 26.43 kg/m  Gen: Well NAD  Lab Results  Component Value Date   PSA 2.29 05/11/2016   PSA 0.58 10/20/2014   PSA 0.47 11/12/2012   Lab Results  Component Value Date   CHOL 172 05/11/2016   HDL 60 05/11/2016   LDLCALC 99 05/11/2016   TRIG 67 05/11/2016   CHOLHDL 2.9 05/11/2016     No results found for this or any previous visit (from the past 24 hour(s)). No results found.  Assessment and Plan: 55 y.o. male with  Elevated PSA. Plan to recheck per PCP recommendations. We'll check PSA free and total today.  Additionally cholesterol is  a bit elevated in May. His Framingham risk score was about 8.4% based on his age and gender blood pressure and cholesterol. We discussed pros and cons of statin therapy. He would like to defer this discussion to his PCP.  Discussed warning signs or symptoms. Please see discharge instructions. Patient expresses understanding.

## 2016-10-14 NOTE — Patient Instructions (Addendum)
  Thank you for coming in today. Return with Dr Neva SeatGreene in November.  Return sooner if needed.  We will contact you with the rest results next week.      IF you received an x-ray today, you will receive an invoice from [facility]. Please contact [facility] at [phone] with questions or concerns regarding your invoice.   IF you received labwork today, you will receive an invoice from [facility]. Please contact [facility] at [phone] with questions or concerns regarding your invoice.   Our billing staff will not be able to assist you with questions regarding bills from these companies.  You will be contacted with the lab results as soon as they are available. The fastest way to get your results is to activate your My Chart account. Instructions are located on the last page of this paperwork. If you have not heard from us regarding the results in 2 weeks, please contact this office.

## 2016-10-19 LAB — PSA, TOTAL AND FREE
PSA, % FREE: 75 % (ref >25)
PSA, FREE: 0.3 ng/mL
PSA, Total: 0.4 ng/mL (ref <=4.0)

## 2016-11-13 ENCOUNTER — Other Ambulatory Visit: Payer: Self-pay | Admitting: Physician Assistant

## 2016-11-13 ENCOUNTER — Other Ambulatory Visit: Payer: Self-pay | Admitting: Family Medicine

## 2016-11-13 DIAGNOSIS — I10 Essential (primary) hypertension: Secondary | ICD-10-CM

## 2016-11-13 DIAGNOSIS — J302 Other seasonal allergic rhinitis: Secondary | ICD-10-CM

## 2016-11-17 ENCOUNTER — Ambulatory Visit (INDEPENDENT_AMBULATORY_CARE_PROVIDER_SITE_OTHER): Payer: BLUE CROSS/BLUE SHIELD | Admitting: Family Medicine

## 2016-11-17 ENCOUNTER — Encounter: Payer: Self-pay | Admitting: Family Medicine

## 2016-11-17 VITALS — BP 126/90 | HR 83 | Temp 98.9°F | Resp 16 | Ht 70.25 in | Wt 183.0 lb

## 2016-11-17 DIAGNOSIS — J309 Allergic rhinitis, unspecified: Secondary | ICD-10-CM

## 2016-11-17 DIAGNOSIS — I10 Essential (primary) hypertension: Secondary | ICD-10-CM

## 2016-11-17 DIAGNOSIS — R0981 Nasal congestion: Secondary | ICD-10-CM

## 2016-11-17 DIAGNOSIS — J302 Other seasonal allergic rhinitis: Secondary | ICD-10-CM

## 2016-11-17 DIAGNOSIS — E785 Hyperlipidemia, unspecified: Secondary | ICD-10-CM

## 2016-11-17 LAB — LIPID PANEL
CHOLESTEROL: 165 mg/dL (ref <200)
HDL: 50 mg/dL (ref >40)
LDL Cholesterol: 105 mg/dL — ABNORMAL HIGH (ref <100)
Total CHOL/HDL Ratio: 3.3 Ratio (ref <5.0)
Triglycerides: 52 mg/dL (ref <150)
VLDL: 10 mg/dL (ref <30)

## 2016-11-17 LAB — COMPLETE METABOLIC PANEL WITH GFR
ALBUMIN: 4 g/dL (ref 3.6–5.1)
ALK PHOS: 43 U/L (ref 40–115)
ALT: 23 U/L (ref 9–46)
AST: 22 U/L (ref 10–35)
BILIRUBIN TOTAL: 0.5 mg/dL (ref 0.2–1.2)
BUN: 16 mg/dL (ref 7–25)
CALCIUM: 9.2 mg/dL (ref 8.6–10.3)
CO2: 28 mmol/L (ref 20–31)
Chloride: 102 mmol/L (ref 98–110)
Creat: 1.09 mg/dL (ref 0.70–1.33)
GFR, EST NON AFRICAN AMERICAN: 77 mL/min (ref >=60)
GFR, Est African American: 88 mL/min (ref >=60)
GLUCOSE: 107 mg/dL — AB (ref 65–99)
POTASSIUM: 4 mmol/L (ref 3.5–5.3)
SODIUM: 138 mmol/L (ref 135–146)
TOTAL PROTEIN: 7 g/dL (ref 6.1–8.1)

## 2016-11-17 MED ORDER — METOPROLOL SUCCINATE ER 50 MG PO TB24: ORAL_TABLET | ORAL | 1 refills | Status: DC

## 2016-11-17 MED ORDER — HYDROCHLOROTHIAZIDE 12.5 MG PO CAPS: ORAL_CAPSULE | ORAL | 1 refills | Status: DC

## 2016-11-17 MED ORDER — DESLORATADINE 5 MG PO TABS: 5.0000 mg | ORAL_TABLET | Freq: Every day | ORAL | 3 refills | Status: DC

## 2016-11-17 MED ORDER — FLUTICASONE PROPIONATE 50 MCG/ACT NA SUSP: NASAL | 6 refills | Status: DC

## 2016-11-17 NOTE — Patient Instructions (Addendum)
As your sinus symptoms are improving, no new medications for now. If pain, pressure worsens again, especially with persistent discolored nasal discharge, let me know and I may be able to call in an antibiotic for sinus infection. Saline nasal spray, Flonase nasal spray, Clarinex as needed for allergies.  No change in blood pressure medicines today. I will check her cholesterol again and can decide on medication if needed based on those results. Let me know if you have any questions.     IF you received an x-ray today, you will receive an invoice from [facility]. Please contact [facility] at [phone] with questions or concerns regarding your invoice.   IF you received labwork today, you will receive an invoice from [facility]. Please contact [facility] at [phone] with questions or concerns regarding your invoice.   Our billing staff will not be able to assist you with questions regarding bills from these companies.  You will be contacted with the lab results as soon as they are available. The fastest way to get your results is to activate your My Chart account. Instructions are located on the last page of this paperwork. If you have not heard from us regarding the results in 2 weeks, please contact this office.

## 2016-11-17 NOTE — Progress Notes (Signed)
By signing my name below, I, Mesha Guinyard, attest that this documentation has been prepared under the direction and in the presence of [name], MD.  Electronically Signed: [name], Medical Scribe. 11/17/16. 1:38 PM.  Subjective:    Patient ID: Damon GerlachLindsey Q Butler, male    DOB: 1960-12-20, 55 y.o.   MRN: [id number]  HPI Chief Complaint  Patient presents with  . Hypertension    HPI Comments: Damon GerlachLindsey Q [place] is a 55 y.o. male who presents to the [facility] for HTN follow-up. He was last seen by Dr. Denyse Amassorey for elevated PSA. PSA had decreased to nl rage.  HTN: Takes ASA, toprol-xl 50mg  QD, and HCTZ 12.5mg . Was on metoprolol-HCTZ last visit. Changed to once daily dosing of toprol-xl 50mg  QD; borderline controlled. At that visit bp was 144/96 RTC precautions. 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 BP at home runs 120/70-80. Denies experiencing negative side effects such as light-headedness, dizziness, chest pain, trouble breathing and other sxs. No further intolerance to metoprolol.  Lab Results  Component Value Date   CREATININE 0.95 05/11/2016   BP Readings from Last 3 Encounters:  11/17/16 126/90  10/14/16 120/70  05/11/16 (!) 144/96   HLD: Borderline elevated with 10 year risk. See last visit with  Dr. Denyse Amassorey. Has not started statin.  Lab Results  Component Value Date   CHOL 172 05/11/2016   HDL 60 05/11/2016   LDLCALC 99 05/11/2016   TRIG 67 05/11/2016   CHOLHDL 2.9 05/11/2016   Lab Results  Component Value Date   ALT 20 05/11/2016   AST 22 05/11/2016   ALKPHOS 49 05/11/2016   BILITOT 0.9 05/11/2016   Wt Readings from Last 3 Encounters:  11/17/16 183 lb (83 kg)  10/14/16 184 lb 3.2 oz (83.6 kg)  05/11/16 178 lb 9.6 oz (81 kg)    Sinus congestion: with sputum and has been going on for the past 3 weeks. Took flonase and other OTC medications. He hasn't been taking clarinex due to running out.  Overall his symptoms have improved. No fever.  Patient Active Problem List   Diagnosis Date Noted  . Elevated PSA 10/14/2016  . Framingham cardiac risk <10% in next 10 years 10/14/2016  . 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 OA (osteoarthritis) of knee 03/24/2014  . Other and unspecified hyperlipidemia 08/12/2013  . Acne 08/12/2013  . HTN (hypertension) 11/12/2012   Past Medical History:  Diagnosis Date  . Allergy   . Anxiety   . Arthritis    OA AND PAIN RT KNEE  . Heart murmur   . Hypertension   . Sinus problem    Past Surgical History:  Procedure Laterality Date  . FRACTURE SURGERY  1982   RT KNEE  . JOINT REPLACEMENT    . KELOID SURGERY     BACK OF NECK  1986 & 1988  . KNEE ARTHROSCOPY Right 11/25/2014   Procedure: RIGHT ARTHROSCOPY KNEE/SYNOVECTOMY;  Surgeon: Loanne DrillingFrank Aluisio V, MD;  Location: [facility];  Service: Orthopedics;  Laterality: Right;  . TOTAL KNEE ARTHROPLASTY Right 03/24/2014   Procedure: RIGHT TOTAL KNEE ARTHROPLASTY WITH HARDWARE REMOVAL;  Surgeon: Loanne DrillingFrank V Aluisio, MD;  Location: [facility];  Service: Orthopedics;  Laterality: Right;   Allergies  Allergen Reactions  . Lisinopril Swelling    LIPS   . Nifedipine Other (See Comments)    Gingival hyperplasia.    Prior to Admission medications   Medication  Sig Start Date End Date Taking? Authorizing Provider  aspirin 81 MG tablet Take 81 mg by mouth daily after lunch.     Historical Provider, MD  desloratadine (CLARINEX) 5 MG tablet Take 1 tablet (5 mg total) by mouth daily. 11/08/15   Morrell RiddleSarah L Weber, PA-C  Fish Oil-Cholecalciferol (FISH OIL + D3 PO) Take 1 capsule by mouth daily after lunch.     Historical Provider, MD  fluticasone (FLONASE) 50 MCG/ACT nasal spray SHAKE LIQUID AND USE 2 SPRAYS IN [facility] NOSTRIL DAILY 11/13/16   Morrell RiddleSarah L Weber, PA-C  hydrochlorothiazide (MICROZIDE) 12.5 MG capsule TAKE 1 CAPSULE(12.5 MG) BY MOUTH DAILY 05/11/16   Shade FloodJeffrey R Soyla Bainter, MD  metoprolol  succinate (TOPROL-XL) 50 MG 24 hr tablet TAKE 1 TABLET BY MOUTH DAILY( TAKE WITH IMMEDIATELY FOLLOWING A MEAL) 11/13/16   Shade FloodJeffrey R Moishe Schellenberg, MD  Propylene Glycol (SYSTANE BALANCE OP) Place 1 drop into both eyes daily as needed (lubrication.).    Historical Provider, MD   Social History   Social History  . Marital status: Single    Spouse name: N/A  . Number of children: N/A  . 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 Smoking status: Never Smoker  . Smokeless tobacco: Never Used  . Alcohol use 1.2 oz/week    2 Glasses of wine per week     Comment: all drinks-once a week 2 drinks  . Drug use: No  . Sexual activity: Yes    Birth control/ protection: Condom     Comment: sex partners in the last 4212 months-2   Other Topics Concern  . Not on file   Social History Narrative  . No narrative on file   Review of Systems  Constitutional: Negative for fatigue and unexpected weight change.  HENT: Positive for congestion.   Eyes: Negative for visual disturbance.  Respiratory: Negative for cough, chest tightness and shortness of breath.   Cardiovascular: Negative for chest pain, palpitations and leg swelling.  Gastrointestinal: Negative for abdominal pain and blood in stool.  Neurological: Negative for dizziness, light-headedness and headaches.   Objective:  Physical Exam  Constitutional: He is oriented to person, place, and time. He appears well-developed and well-nourished.  HENT:  Head: Normocephalic and atraumatic.  Eyes: EOM are normal. Pupils are equal, round, and reactive to light.  Neck: No JVD present. Carotid bruit is not present.  Cardiovascular: Normal rate, regular rhythm and normal heart sounds.  Exam reveals no friction rub.   No murmur heard. Pulmonary/Chest: Effort normal and breath sounds normal. No respiratory distress. He has no wheezes. He has no rales.  Musculoskeletal: He exhibits no edema.    Neurological: He is alert and oriented to person, place, and time.  Skin: Skin is warm and dry.  Psychiatric: He has a normal mood and affect.  Vitals reviewed.  BP 126/90 (BP Location: Left Arm, Cuff Size: Normal)   Pulse 83   Temp 98.9 F (37.2 C) (Oral)   Resp 16   Ht 5' 10.25" (1.784 m)   Wt 183 lb (83 kg)   SpO2 97%   BMI 26.07 kg/m  Assessment & Plan:   Damon Butler is a 55 y.o. male Essential hypertension - Plan: COMPLETE METABOLIC PANEL WITH GFR, Lipid panel, metoprolol succinate (TOPROL-XL) 50 MG 24 hr tablet, hydrochlorothiazide (MICROZIDE) 12.5 MG capsule  - stable. No med changes. Labs pending.   Hyperlipidemia, unspecified hyperlipidemia type - Plan:  Lipid panel  - Borderline 10 year ASCVD risk previously. Recheck lipid panel to discuss further.  Sinus congestion Acute allergic rhinitis, unspecified seasonality, unspecified trigger Seasonal allergic rhinitis, unspecified chronicity, unspecified trigger - Plan: desloratadine (CLARINEX) 5 MG tablet, fluticasone (FLONASE) 50 MCG/ACT nasal spray  -Possibly allergies versus viral syndrome versus sinusitis. Overall improving, decided against antibiotics today. If persistent discolored nasal discharge that is not continuing to improve, or any secondary sickening, can call in antibiotic. Advised to let me know. RTC precautions.  Meds ordered this encounter  Medications  . metoprolol succinate (TOPROL-XL) 50 MG 24 hr tablet    Sig: TAKE 1 TABLET BY MOUTH DAILY( TAKE WITH IMMEDIATELY FOLLOWING A MEAL)    Dispense:  90 tablet    Refill:  1  . hydrochlorothiazide (MICROZIDE) 12.5 MG capsule    Sig: TAKE 1 CAPSULE(12.5 MG) BY MOUTH DAILY    Dispense:  90 capsule    Refill:  1  . desloratadine (CLARINEX) 5 MG tablet    Sig: Take 1 tablet (5 mg total) by mouth daily.    Dispense:  90 tablet    Refill:  3  . fluticasone (FLONASE) 50 MCG/ACT nasal spray    Sig: SHAKE LIQUID AND USE 2 SPRAYS IN EACH NOSTRIL DAILY     Dispense:  16 g    Refill:  6   Patient Instructions   As your sinus symptoms are improving, no new medications for now. If pain, pressure worsens again, especially with persistent discolored nasal discharge, let me know and I may be able to call in an antibiotic for sinus infection. Saline nasal spray, Flonase nasal spray, Clarinex as needed for allergies.  No change in blood pressure medicines today. I will check her cholesterol again and can decide on medication if needed based on those results. Let me know if you have any questions.     IF you received an x-ray today, you will receive an invoice from [facility]. Please contact [facility] at [phone] with questions or concerns regarding your invoice.   IF you received labwork today, you will receive an invoice from [facility]. Please contact [facility] at [phone] with questions or concerns regarding your invoice.   Our billing staff will not be able to assist you with questions regarding bills from these companies.  You will be contacted with the lab results as soon as they are available. The fastest way to get your results is to activate your My Chart account. Instructions are located on the last page of this paperwork. If you have not heard from [place] regarding the results in 2 weeks, please contact this office.       I personally performed the services described in this documentation, which was scribed in my presence. The recorded information has been reviewed and considered, and addended by me as needed.   Signed,   Meredith Staggers, MD [facility].  11/17/16 1:52 PM

## 2017-05-18 ENCOUNTER — Ambulatory Visit (INDEPENDENT_AMBULATORY_CARE_PROVIDER_SITE_OTHER): Payer: BLUE CROSS/BLUE SHIELD | Admitting: Family Medicine

## 2017-05-18 ENCOUNTER — Encounter: Payer: Self-pay | Admitting: Family Medicine

## 2017-05-18 VITALS — BP 134/83 | HR 84 | Temp 98.8°F | Resp 18 | Ht 70.0 in | Wt 183.6 lb

## 2017-05-18 DIAGNOSIS — I10 Essential (primary) hypertension: Secondary | ICD-10-CM | POA: Diagnosis not present

## 2017-05-18 DIAGNOSIS — M24541 Contracture, right hand: Secondary | ICD-10-CM

## 2017-05-18 DIAGNOSIS — J309 Allergic rhinitis, unspecified: Secondary | ICD-10-CM | POA: Diagnosis not present

## 2017-05-18 DIAGNOSIS — Z13220 Encounter for screening for lipoid disorders: Secondary | ICD-10-CM

## 2017-05-18 MED ORDER — HYDROCHLOROTHIAZIDE 12.5 MG PO CAPS: ORAL_CAPSULE | ORAL | 1 refills | Status: DC

## 2017-05-18 MED ORDER — METOPROLOL SUCCINATE ER 50 MG PO TB24: ORAL_TABLET | ORAL | 1 refills | Status: DC

## 2017-05-18 MED ORDER — DESLORATADINE 5 MG PO TABS: 5.0000 mg | ORAL_TABLET | Freq: Every day | ORAL | 3 refills | Status: DC

## 2017-05-18 MED ORDER — FLUTICASONE PROPIONATE 50 MCG/ACT NA SUSP: NASAL | 6 refills | Status: DC

## 2017-05-18 NOTE — Patient Instructions (Addendum)
No change in blood pressure medication at this time.  Continue Claritin and Flonase nasal spray as needed for allergies. I will refer you to hand specialist evaluate the thickened areas in the hand. Does appear to be possible contractures. If your last colonoscopy was 10 years ago, you are due this year. Call and schedule that colonoscopy when you are ready. Let me know if you need a referral.    IF you received an x-ray today, you will receive an invoice from [facility]. Please contact [facility] at [phone] with questions or concerns regarding your invoice.   IF you received labwork today, you will receive an invoice from [facility]. Please contact [facility] at [phone] with questions or concerns regarding your invoice.   Our billing staff will not be able to assist you with questions regarding bills from these companies.  You will be contacted with the lab results as soon as they are available. The fastest way to get your results is to activate your My Chart account. Instructions are located on the last page of this paperwork. If you have not heard from us regarding the results in 2 weeks, please contact this office.

## 2017-05-18 NOTE — Progress Notes (Signed)
By signing my name below, I, Mesha Guinyard, attest that this documentation has been prepared under the direction and in the presence of Meredith StaggersJeffrey Azalea Cedar, MD.  Electronically Signed: [name], Medical Scribe. 05/18/17. 2:38 PM.  Subjective:    Patient ID: Damon Butler, male    DOB: Mar 17, 1961, 56 y.o.   MRN: [id number]  HPI Chief Complaint  Patient presents with  . Hypertension  . Follow-up    HPI Comments: Damon GerlachLindsey Q Riddles is a 56 y.o. male who [facility].  HTN: Takes toprol-xl 50 mg QD, and HCTZ 12.5 mg QD. 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 BP was controlled last visit at 126/90. Pt has been compliant with his medication. His bp has been running around 130"something"; lowest systolic reading 782128. Denies chest pain, SOB, light-headedness, and dizziness. Lab Results  Component Value Date   CREATININE 1.09 11/17/2016   BP Readings from Last 3 Encounters:  05/18/17 134/83  11/17/16 126/90  10/14/16 120/70   Allergic Rhinitis: History of chronic sinus congestion. Has used flonase nasal spry in the past as well as clarinex. His allergies have been doing good and he's had to use flonase and clarinex a few times this season. Denies experiencing sinus issues.  Lipid Screening: Lab Results  Component Value Date   CHOL 165 11/17/2016   HDL 50 11/17/2016   LDLCALC 105 (H) 11/17/2016   TRIG 52 11/17/2016   CHOLHDL 3.3 11/17/2016   Skin on Hand: Reports thinning skin. Onset 6 months ago when he started using hand tools more often.  Colon CA: Pt is considering getting his colonoscopy next year.  Patient Active Problem List   Diagnosis Date Noted  . Elevated PSA 10/14/2016  . Framingham cardiac risk <10% in next 10 years 10/14/2016  . 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 OA (osteoarthritis) of knee 03/24/2014  . Other and unspecified hyperlipidemia 08/12/2013  .  Acne 08/12/2013  . HTN (hypertension) 11/12/2012   Past Medical History:  Diagnosis Date  . Allergy   . Anxiety   . Arthritis    OA AND PAIN RT KNEE  . Heart murmur   . Hypertension   . Sinus problem    Past Surgical History:  Procedure Laterality Date  . FRACTURE SURGERY  1982   RT KNEE  . JOINT REPLACEMENT    . KELOID SURGERY     BACK OF NECK  1986 & 1988  . KNEE ARTHROSCOPY Right 11/25/2014   Procedure: RIGHT ARTHROSCOPY KNEE/SYNOVECTOMY;  Surgeon: Loanne DrillingFrank Aluisio V, MD;  Location: [facility];  Service: Orthopedics;  Laterality: Right;  . TOTAL KNEE ARTHROPLASTY Right 03/24/2014   Procedure: RIGHT TOTAL KNEE ARTHROPLASTY WITH HARDWARE REMOVAL;  Surgeon: Loanne DrillingFrank V Aluisio, MD;  Location: [facility];  Service: Orthopedics;  Laterality: Right;   Allergies  Allergen Reactions  . Lisinopril Swelling    LIPS   . Nifedipine Other (See Comments)    Gingival hyperplasia.    Prior to Admission medications   Medication Sig Start Date End Date Taking? Authorizing Provider  aspirin 81 MG tablet Take 81 mg by mouth daily after lunch.    Yes [provider]  desloratadine (CLARINEX) 5 MG tablet Take 1 tablet (5 mg total) by mouth daily. 11/17/16  Yes [name], Alessandra Sawdey R, MD  Fish Oil-Cholecalciferol (FISH OIL + D3 PO) Take 1 capsule by mouth daily after lunch.    Yes [provider]  fluticasone (FLONASE) 50  MCG/ACT nasal spray SHAKE LIQUID AND USE 2 SPRAYS IN EACH NOSTRIL DAILY 11/17/16  Yes Shade Flood, MD  hydrochlorothiazide (MICROZIDE) 12.5 MG capsule TAKE 1 CAPSULE(12.5 MG) BY MOUTH DAILY 11/17/16  Yes Shade Flood, MD  metoprolol succinate (TOPROL-XL) 50 MG 24 hr tablet TAKE 1 TABLET BY MOUTH DAILY( TAKE WITH IMMEDIATELY FOLLOWING A MEAL) 11/17/16  Yes Shade Flood, MD  Propylene Glycol (SYSTANE BALANCE OP) Place 1 drop into both eyes daily as needed (lubrication.).   Yes [provider]   Social History   Social History  . Marital status: Single     Spouse name: N/A  . Number of children: N/A  . Years of education: college   Occupational History  . auto Curator    Social History Main Topics  . Smoking status: Never Smoker  . Smokeless tobacco: Never Used  . Alcohol use 1.2 oz/week    2 Glasses of wine per week     Comment: all drinks-once a week 2 drinks  . Drug use: No  . Sexual activity: Yes    Birth control/ protection: Condom     Comment: sex partners in the last 6 months-2   Other Topics Concern  . Not on file   Social History Narrative  . No narrative on file   Review of Systems  HENT: Negative for congestion, postnasal drip, rhinorrhea, sinus pain and sinus pressure.   Respiratory: Negative for shortness of breath.   Cardiovascular: Negative for chest pain.  Skin: Negative for rash and wound.  Neurological: Negative for dizziness and light-headedness.   Objective:  Physical Exam  Constitutional: He appears well-developed and well-nourished. No distress.  HENT:  Head: Normocephalic and atraumatic.  Eyes: Conjunctivae are normal.  Neck: Neck supple.  Cardiovascular: Normal rate, regular rhythm and normal heart sounds.  Exam reveals no gallop and no friction rub.   No murmur heard. Pulmonary/Chest: Effort normal. No respiratory distress. He has no wheezes. He has no rales.  Musculoskeletal: He exhibits no edema.  Band of thickening at his proximal flexor area of right 3rd finger, but no apparent contractor No extensor Thickened band at the flexor area of the right proximal phalanx with slight decreased extension of distal phalanx Strength in at all phalanx and digits  Neurological: He is alert.  Skin: Skin is warm and dry.  Psychiatric: He has a normal mood and affect. His behavior is normal.  Nursing note and vitals reviewed.   Vitals:   05/18/17 1424  BP: 134/83  Pulse: 84  Resp: 18  Temp: 98.8 F (37.1 C)  TempSrc: Oral  SpO2: 95%  Weight: 183 lb 9.6 oz (83.3 kg)  Height: 5\' 10"  (1.778 m)    Body mass index is 26.34 kg/m. Assessment & Plan:   Damon Butler is a 56 y.o. male Essential hypertension - Plan: hydrochlorothiazide (MICROZIDE) 12.5 MG capsule, metoprolol succinate (TOPROL-XL) 50 MG 24 hr tablet, Care order/instruction:  - Stable, continue same dose of HCTZ, Toprol-XL 50 mg daily, check lipids, labs.  Contracture, right hand - Plan: Ambulatory referral to Hand Surgery  - Appears to have possible early contracture of third and fifth flexors. Fifth with slight decreased extension. Refer to hand surgeon for evaluation and treatment.  Allergic rhinitis, unspecified seasonality, unspecified trigger - Plan: fluticasone (FLONASE) 50 MCG/ACT nasal spray, desloratadine (CLARINEX) 5 MG tablet  - Stable with Clarinex and episodic Flonase. Refilled Flonase.   Screening for hyperlipidemia - Plan: Comprehensive metabolic panel, Lipid panel  -  Repeat lipids.  Meds ordered this encounter  Medications  . fluticasone (FLONASE) 50 MCG/ACT nasal spray    Sig: SHAKE LIQUID AND USE 2 SPRAYS IN EACH NOSTRIL DAILY    Dispense:  16 g    Refill:  6  . desloratadine (CLARINEX) 5 MG tablet    Sig: Take 1 tablet (5 mg total) by mouth daily.    Dispense:  90 tablet    Refill:  3  . hydrochlorothiazide (MICROZIDE) 12.5 MG capsule    Sig: TAKE 1 CAPSULE(12.5 MG) BY MOUTH DAILY    Dispense:  90 capsule    Refill:  1  . metoprolol succinate (TOPROL-XL) 50 MG 24 hr tablet    Sig: TAKE 1 TABLET BY MOUTH DAILY( TAKE WITH IMMEDIATELY FOLLOWING A MEAL)    Dispense:  90 tablet    Refill:  1   Patient Instructions   No change in blood pressure medication at this time.  Continue Claritin and Flonase nasal spray as needed for allergies. I will refer you to hand specialist evaluate the thickened areas in the hand. Does appear to be possible contractures. If your last colonoscopy was 10 years ago, you are due this year. Call and schedule that colonoscopy when you are ready. Let me know if you  need a referral.    IF you received an x-ray today, you will receive an invoice from [facility]. Please contact [facility] at [phone] with questions or concerns regarding your invoice.   IF you received labwork today, you will receive an invoice from [facility]. Please contact [facility] at [phone] with questions or concerns regarding your invoice.   Our billing staff will not be able to assist you with questions regarding bills from these companies.  You will be contacted with the lab results as soon as they are available. The fastest way to get your results is to activate your My Chart account. Instructions are located on the last page of this paperwork. If you have not heard from [place] regarding the results in 2 weeks, please contact this office.       I personally performed the services described in this documentation, which was scribed in my presence. The recorded information has been reviewed and considered for accuracy and completeness, addended by me as needed, and agree with information above.  Signed,   Meredith Staggers, MD [facility].  05/18/17 3:14 PM

## 2017-05-19 LAB — COMPREHENSIVE METABOLIC PANEL
ALBUMIN: 4.6 g/dL (ref 3.5–5.5)
ALK PHOS: 49 IU/L (ref 39–117)
ALT: 29 IU/L (ref 0–44)
AST: 26 IU/L (ref 0–40)
Albumin/Globulin Ratio: 1.5 (ref 1.2–2.2)
BUN / CREAT RATIO: 16 (ref 9–20)
BUN: 16 mg/dL (ref 6–24)
Bilirubin Total: 0.5 mg/dL (ref 0.0–1.2)
CO2: 25 mmol/L (ref 18–29)
CREATININE: 1.01 mg/dL (ref 0.76–1.27)
Calcium: 9.7 mg/dL (ref 8.7–10.2)
Chloride: 99 mmol/L (ref 96–106)
GFR calc Af Amer: 97 mL/min/{1.73_m2} (ref >59)
GFR calc non Af Amer: 84 mL/min/{1.73_m2} (ref >59)
GLUCOSE: 96 mg/dL (ref 65–99)
Globulin, Total: 3 g/dL (ref 1.5–4.5)
Potassium: 4.2 mmol/L (ref 3.5–5.2)
Sodium: 140 mmol/L (ref 134–144)
TOTAL PROTEIN: 7.6 g/dL (ref 6.0–8.5)

## 2017-05-19 LAB — LIPID PANEL
CHOLESTEROL TOTAL: 190 mg/dL (ref 100–199)
Chol/HDL Ratio: 3.2 ratio (ref 0.0–5.0)
HDL: 59 mg/dL (ref >39)
LDL CALC: 119 mg/dL — AB (ref 0–99)
Triglycerides: 62 mg/dL (ref 0–149)
VLDL CHOLESTEROL CAL: 12 mg/dL (ref 5–40)

## 2017-09-11 ENCOUNTER — Other Ambulatory Visit: Payer: Self-pay | Admitting: Family Medicine

## 2017-09-11 DIAGNOSIS — I10 Essential (primary) hypertension: Secondary | ICD-10-CM

## 2017-11-23 ENCOUNTER — Ambulatory Visit (INDEPENDENT_AMBULATORY_CARE_PROVIDER_SITE_OTHER): Payer: BLUE CROSS/BLUE SHIELD | Admitting: Family Medicine

## 2017-11-23 ENCOUNTER — Other Ambulatory Visit: Payer: Self-pay

## 2017-11-23 ENCOUNTER — Encounter: Payer: Self-pay | Admitting: Family Medicine

## 2017-11-23 VITALS — BP 158/80 | HR 93 | Temp 98.5°F | Resp 16 | Ht 70.0 in | Wt 183.0 lb

## 2017-11-23 DIAGNOSIS — J309 Allergic rhinitis, unspecified: Secondary | ICD-10-CM

## 2017-11-23 DIAGNOSIS — I10 Essential (primary) hypertension: Secondary | ICD-10-CM

## 2017-11-23 DIAGNOSIS — Z0000 Encounter for general adult medical examination without abnormal findings: Secondary | ICD-10-CM

## 2017-11-23 DIAGNOSIS — Z1329 Encounter for screening for other suspected endocrine disorder: Secondary | ICD-10-CM

## 2017-11-23 DIAGNOSIS — E785 Hyperlipidemia, unspecified: Secondary | ICD-10-CM

## 2017-11-23 DIAGNOSIS — Z125 Encounter for screening for malignant neoplasm of prostate: Secondary | ICD-10-CM | POA: Diagnosis not present

## 2017-11-23 MED ORDER — METOPROLOL SUCCINATE ER 50 MG PO TB24
ORAL_TABLET | ORAL | 1 refills | Status: DC
Start: 2017-11-23 — End: 2018-05-24

## 2017-11-23 MED ORDER — FLUTICASONE PROPIONATE 50 MCG/ACT NA SUSP: NASAL | 6 refills | Status: DC

## 2017-11-23 MED ORDER — DESLORATADINE 5 MG PO TABS: 5.0000 mg | ORAL_TABLET | Freq: Every day | ORAL | 3 refills | Status: DC

## 2017-11-23 NOTE — Patient Instructions (Addendum)
Blood pressure elevated today, but likely as off meds. Continue same doses for now., Keep a record of your blood pressures outside of the office and if remaining over 130/80 in next week or two - return to discuss changes.   Call your eye care provider for appointment as soon as possible to discuss blurry vision. Return to the clinic or go to the nearest emergency room if any of your symptoms worsen or new symptoms occur.   I will screen for thyroid issue for temperature issues, but follow up if those symptoms worsen or are persistent.   Tylenol if needed for aches as may be safer than Alleve. If you use alleve, make sure blood pressure is controlled and only intermittent or rare use if possible.   Call Dr. Haywood PaoHung's office to determine when you are due for repeat colonoscopy.   Cholesterol mildly elevated prior, I will recheck it again today.  Exercise and watching diet can help. I would not recommend starting meds yet unless levels are increasing.   Thanks for coming in today.   Keeping you healthy  Get these tests  Blood pressure- Have your blood pressure checked once a year by your healthcare provider.  Normal blood pressure is 120/80  Weight- Have your body mass index (BMI) calculated to screen for obesity.  BMI is a measure of body fat based on height and weight. You can also calculate your own BMI at [email or web address]  Cholesterol- Have your cholesterol checked every year.  Diabetes- Have your blood sugar checked regularly if you have high blood pressure, high cholesterol, have a family history of diabetes or if you are overweight.  Screening for Colon Cancer- Colonoscopy starting at age 650.  Screening may begin sooner depending on your family history and other health conditions. Follow up colonoscopy as directed by your Gastroenterologist.  Screening for Prostate Cancer- Both blood work (PSA) and a rectal exam help screen for Prostate Cancer.  Screening begins at age 55  with African-American men and at age 55 with Caucasian men.  Screening may begin sooner depending on your family history.  Take these medicines  Aspirin- One aspirin daily can help prevent Heart disease and Stroke.  Flu shot- Every fall.  Tetanus- Every 10 years.  Zostavax- Once after the age of 55 to prevent Shingles.  Pneumonia shot- Once after the age of 55; if you are younger than 6165, ask your healthcare provider if you need a Pneumonia shot.  Take these steps  Don't smoke- If you do smoke, talk to your doctor about quitting.  For tips on how to quit, go to www.smokefree.gov or call 1-800-QUIT-NOW.  Be physically active- Exercise 5 days a week for at least 30 minutes.  If you are not already physically active start slow and gradually work up to 30 minutes of moderate physical activity.  Examples of moderate activity include walking briskly, mowing the yard, dancing, swimming, bicycling, etc.  Eat a healthy diet- Eat a variety of healthy food such as fruits, vegetables, low fat milk, low fat cheese, yogurt, lean meant, poultry, fish, beans, tofu, etc. For more information go to www.thenutritionsource.org  Drink alcohol in moderation- Limit alcohol intake to less than two drinks a day. Never drink and drive.  Dentist- Brush and floss twice daily; visit your dentist twice a year.  Depression- Your emotional health is as important as your physical health. If you're feeling down, or losing interest in things you would normally enjoy please talk to your healthcare provider.  Eye exam- Visit your eye doctor every year.  Safe sex- If you may be exposed to a sexually transmitted infection, use a condom.  Seat belts- Seat belts can save your life; always wear one.  Smoke/Carbon Monoxide detectors- These detectors need to be installed on the appropriate level of your home.  Replace batteries at least once a year.  Skin cancer- When out in the sun, cover up and use sunscreen 15 SPF or  higher.  Violence- If anyone is threatening you, please tell your healthcare provider.  Living Will/ Health care power of attorney- Speak with your healthcare provider and family.     IF you received an x-ray today, you will receive an invoice from [facility]. Please contact [facility] at [phone] with questions or concerns regarding your invoice.   IF you received labwork today, you will receive an invoice from [facility]. Please contact [facility] at [phone] with questions or concerns regarding your invoice.   Our billing staff will not be able to assist you with questions regarding bills from these companies.  You will be contacted with the lab results as soon as they are available. The fastest way to get your results is to activate your My Chart account. Instructions are located on the last page of this paperwork. If you have not heard from us regarding the results in 2 weeks, please contact this office.

## 2017-11-23 NOTE — Progress Notes (Signed)
Subjective:  By signing my name below, I, Stann Ore, attest that this documentation has been prepared under the direction and in the presence of Meredith Staggers, MD. Electronically Signed: Stann Ore, Scribe. 11/23/2017 , 2:40 PM .  Patient was seen in Room 11 .   Patient ID: KYCEN SPALLA, male    DOB: Jul 29, 1961, 55 y.o.   MRN: [id number] Chief Complaint  Patient presents with  . Annual Exam  . Medication Refill    hydrochlorothiazide   HPI SHAFER SWAMY is a 56 y.o. male Here for annual physical. He has a history of HTN, hyperlipidemia, acne, and elevated PSA. He is fasting today.   HTN He takes HCTZ 12.5mg  QD and Toprol 50mg  QD.   He took his medications yesterday, but hasn't for about 4-5 days prior. [place] was in [place] for his friend's funeral, and he forgot his medications. He hasn't taken medications today due to fasting. He denies any known side effects with his medications. He notes intermittent vision blurriness that lasts temporarily but vision returns to normal.   Hyperlipidemia He takes fish oil.   The 10-year ASCVD risk score Denman George DC Montez Hageman., et al., 2013) is: 15%   Values used to calculate the score:     Age: 22 years     Sex: Male     Is Non-Hispanic African American: Yes     Diabetic: No     Tobacco smoker: No     Systolic Blood Pressure: 158 mmHg     Is BP treated: Yes     HDL Cholesterol: 59 mg/dL     Total Cholesterol: 190 mg/dL  His cholesterol was only mildly elevated at last visit.   Wt Readings from Last 3 Encounters:  11/23/17 183 lb (83 kg)  05/18/17 183 lb 9.6 oz (83.3 kg)  11/17/16 183 lb (83 kg)   Allergic rhinitis He takes Clarinex and Flonase as needed. He notes congestion and postnasal drip with seasonal allergies. He's been taking Clarinex and Flonase for seasonal allergies.   Heat/cold intolerant He mentions feeling more intolerant to extremes of temperature since his right knee surgery done a year ago. He denies  feeling too hot or too cold constantly.   Arthralgias  He mentions having some occasional joint aches, neck aches and back pain. He takes aleve as needed.   Cancer Screening Colonoscopy: last done in 2011, believe done with Dr. Elnoria Howard.  Prostate cancer screening: Most recent PSA of 0.4 in Oct 2017 with normal free PSA.   Lab Results  Component Value Date   PSA 2.29 05/11/2016   PSA 0.58 10/20/2014   PSA 0.47 11/12/2012    Immunizations Immunization History  Administered Date(s) Administered  . Tdap 02/25/2013   Flu shot: declines flu shot. "Never had the flu shot, never had the flu."   Depression Depression screen [facility] 2/9 11/23/2017 05/18/2017 11/17/2016 10/14/2016 05/11/2016  Decreased Interest 0 0 0 0 0  Down, Depressed, Hopeless 0 0 0 0 0  PHQ - 2 Score 0 0 0 0 0    Vision  Visual Acuity Screening   Right eye Left eye Both eyes  Without correction: 20/20 20/20 20/20   With correction:      He hasn't seen his eye doctor in about a year. He notes intermittent blurry vision.   Dentist He's seen a dentist recently.   Exercise He does stretches and squats in the morning. He works as a Curator with some physical strain. He exercises about 10  minutes every day.   Patient Active Problem List   Diagnosis Date Noted  . Elevated PSA 10/14/2016  . Framingham cardiac risk <10% in next 10 years 10/14/2016  . 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 OA (osteoarthritis) of knee 03/24/2014  . Other and unspecified hyperlipidemia 08/12/2013  . Acne 08/12/2013  . HTN (hypertension) 11/12/2012   Past Medical History:  Diagnosis Date  . Allergy   . Anxiety   . Arthritis    OA AND PAIN RT KNEE  . Heart murmur   . Hypertension   . Sinus problem    Past Surgical History:  Procedure Laterality Date  . FRACTURE SURGERY  1982   RT KNEE  . JOINT REPLACEMENT    . KELOID SURGERY     BACK OF NECK  1986 & 1988  . KNEE ARTHROSCOPY Right 11/25/2014    Procedure: RIGHT ARTHROSCOPY KNEE/SYNOVECTOMY;  Surgeon: Loanne DrillingFrank Aluisio V, MD;  Location: [facility];  Service: Orthopedics;  Laterality: Right;  . TOTAL KNEE ARTHROPLASTY Right 03/24/2014   Procedure: RIGHT TOTAL KNEE ARTHROPLASTY WITH HARDWARE REMOVAL;  Surgeon: Loanne DrillingFrank V Aluisio, MD;  Location: [facility];  Service: Orthopedics;  Laterality: Right;   Allergies  Allergen Reactions  . Lisinopril Swelling    LIPS   . Nifedipine Other (See Comments)    Gingival hyperplasia.    Prior to Admission medications   Medication Sig Start Date End Date Taking? Authorizing Provider  aspirin 81 MG tablet Take 81 mg by mouth daily after lunch.     [provider]  desloratadine (CLARINEX) 5 MG tablet Take 1 tablet (5 mg total) by mouth daily. 05/18/17   [name], Sharisa Toves R, MD  Fish Oil-Cholecalciferol (FISH OIL + D3 PO) Take 1 capsule by mouth daily after lunch.     [provider]  fluticasone (FLONASE) 50 MCG/ACT nasal spray SHAKE LIQUID AND USE 2 SPRAYS IN EACH NOSTRIL DAILY 05/18/17   [name], Kayte Borchard R, MD  hydrochlorothiazide (MICROZIDE) 12.5 MG capsule TAKE 1 CAPSULE(12.5 MG) BY MOUTH DAILY 09/14/17   [name], Matt Delpizzo R, MD  metoprolol succinate (TOPROL-XL) 50 MG 24 hr tablet TAKE 1 TABLET BY MOUTH DAILY( TAKE WITH IMMEDIATELY FOLLOWING A MEAL) 05/18/17   [name], Gael Londo R, MD  Propylene Glycol (SYSTANE BALANCE OP) Place 1 drop into both eyes daily as needed (lubrication.).    [provider]   Social History   Socioeconomic History  . Marital status: Single    Spouse name: Not on file  . Number of children: Not on file  . Years of education: college  . Highest education level: Not on file  Social Needs  . Financial resource strain: Not on file  . Food insecurity - worry: Not on file  . Food insecurity - inability: Not on file  . Transportation needs - medical: Not on file  . Transportation needs - non-medical: Not on file  Occupational History  . Occupation: Journalist, newspaperauto mechanic    Tobacco Use  . Smoking status: Never Smoker  . Smokeless tobacco: Never Used  Substance and Sexual Activity  . Alcohol use: Yes    Alcohol/week: 1.2 oz    Types: 2 Glasses of wine per week    Comment: all drinks-once a week 2 drinks  . Drug use: No  . Sexual activity: Yes    Birth control/protection: Condom    Comment: sex partners in the last 3412 months-2  Other Topics Concern  . Not on file  Social History Narrative  .  Not on file   Review of Systems 13 point ROS - positive for activity change, congestion, postnasal drip, eye redness, visual problems, cold/heat intolerance, joint pain, back pain, neck stiffness, seasonal allergies     Objective:   Physical Exam  Constitutional: He is oriented to person, place, and time. He appears well-developed and well-nourished.  HENT:  Head: Normocephalic and atraumatic.  Right Ear: External ear normal.  Left Ear: External ear normal.  Mouth/Throat: Oropharynx is clear and moist.  Eyes: Conjunctivae and EOM are normal. Pupils are equal, round, and reactive to light.  Neck: Normal range of motion. Neck supple. No thyromegaly present.  Cardiovascular: Normal rate, regular rhythm, normal heart sounds and intact distal pulses.  Pulmonary/Chest: Effort normal and breath sounds normal. No respiratory distress. He has no wheezes.  Abdominal: Soft. He exhibits no distension. There is no tenderness. Hernia confirmed negative in the right inguinal area and confirmed negative in the left inguinal area.  Genitourinary: Prostate normal.  Musculoskeletal: Normal range of motion. He exhibits no edema or tenderness.  Lymphadenopathy:    He has no cervical adenopathy.  Neurological: He is alert and oriented to person, place, and time. He has normal reflexes.  Skin: Skin is warm and dry.  Psychiatric: He has a normal mood and affect. His behavior is normal.  Vitals reviewed.    Vitals:   11/23/17 1350  BP: (!) 158/80  Pulse: 93  Resp: 16  Temp:  98.5 F (36.9 C)  TempSrc: Oral  SpO2: 97%  Weight: 183 lb (83 kg)  Height: 5\' 10"  (1.778 m)      Assessment & Plan:   JARNELL CORDARO is a 56 y.o. male Annual physical exam  - -anticipatory guidance as below in AVS, screening labs above. Health maintenance items as above in HPI discussed/recommended as applicable.   Essential hypertension - Plan: Comprehensive metabolic panel, metoprolol succinate (TOPROL-XL) 50 MG 24 hr tablet  - Elevated off meds, restart usual regimen, monitor home readings, and RTC precautions if remaining over 130/80.  Allergic rhinitis, unspecified seasonality, unspecified trigger - Plan: fluticasone (FLONASE) 50 MCG/ACT nasal spray, desloratadine (CLARINEX) 5 MG tablet   Stable, tolerating current regimen. Medications refilled. Labs pending as above.   Screening for prostate cancer - Plan: PSA  - We discussed pros and cons of prostate cancer screening, and after this discussion, he chose to have screening done. PSA obtained, and no concerning findings on DRE.   Hyperlipidemia, unspecified hyperlipidemia type - Plan: Comprehensive metabolic panel, Lipid panel  - Discuss potential need for statin, will repeat labs then decide from there. Okay to continue fish oil for now, diet monitoring and exercise.  Screening for thyroid disorder - Plan: TSH  -Fluctuations in temperature tolerance, check TSH.  Meds ordered this encounter  Medications  . metoprolol succinate (TOPROL-XL) 50 MG 24 hr tablet    Sig: TAKE 1 TABLET BY MOUTH DAILY( TAKE WITH IMMEDIATELY FOLLOWING A MEAL)    Dispense:  90 tablet    Refill:  1  . fluticasone (FLONASE) 50 MCG/ACT nasal spray    Sig: SHAKE LIQUID AND USE 2 SPRAYS IN EACH NOSTRIL DAILY    Dispense:  16 g    Refill:  6  . desloratadine (CLARINEX) 5 MG tablet    Sig: Take 1 tablet (5 mg total) by mouth daily.    Dispense:  90 tablet    Refill:  3   Patient Instructions    Blood pressure elevated today, but likely as  off  meds. Continue same doses for now., Keep a record of your blood pressures outside of the office and if remaining over 130/80 in next week or two - return to discuss changes.   Call your eye care provider for appointment as soon as possible to discuss blurry vision. Return to the clinic or go to the nearest emergency room if any of your symptoms worsen or new symptoms occur.   I will screen for thyroid issue for temperature issues, but follow up if those symptoms worsen or are persistent.   Tylenol if needed for aches as may be safer than Alleve. If you use alleve, make sure blood pressure is controlled and only intermittent or rare use if possible.   Call Dr. Haywood Pao office to determine when you are due for repeat colonoscopy.   Cholesterol mildly elevated prior, I will recheck it again today.  Exercise and watching diet can help. I would not recommend starting meds yet unless levels are increasing.   Thanks for coming in today.   Keeping you healthy  Get these tests  Blood pressure- Have your blood pressure checked once a year by your healthcare provider.  Normal blood pressure is 120/80  Weight- Have your body mass index (BMI) calculated to screen for obesity.  BMI is a measure of body fat based on height and weight. You can also calculate your own BMI at Program[employer].[email or web address].  Cholesterol- Have your cholesterol checked every year.  Diabetes- Have your blood sugar checked regularly if you have high blood pressure, high cholesterol, have a family history of diabetes or if you are overweight.  Screening for Colon Cancer- Colonoscopy starting at age 70.  Screening may begin sooner depending on your family history and other health conditions. Follow up colonoscopy as directed by your Gastroenterologist.  Screening for Prostate Cancer- Both blood work (PSA) and a rectal exam help screen for Prostate Cancer.  Screening begins at age 47 with African-American men and at age 59 with Caucasian  men.  Screening may begin sooner depending on your family history.  Take these medicines  Aspirin- One aspirin daily can help prevent Heart disease and Stroke.  Flu shot- Every fall.  Tetanus- Every 10 years.  Zostavax- Once after the age of 49 to prevent Shingles.  Pneumonia shot- Once after the age of 106; if you are younger than 50, ask your healthcare provider if you need a Pneumonia shot.  Take these steps  Don't smoke- If you do smoke, talk to your doctor about quitting.  For tips on how to quit, go to www.smokefree.gov or call 1-800-QUIT-NOW.  Be physically active- Exercise 5 days a week for at least 30 minutes.  If you are not already physically active start slow and gradually work up to 30 minutes of moderate physical activity.  Examples of moderate activity include walking briskly, mowing the yard, dancing, swimming, bicycling, etc.  Eat a healthy diet- Eat a variety of healthy food such as fruits, vegetables, low fat milk, low fat cheese, yogurt, lean meant, poultry, fish, beans, tofu, etc. For more information go to www.thenutritionsource.org  Drink alcohol in moderation- Limit alcohol intake to less than two drinks a day. Never drink and drive.  Dentist- Brush and floss twice daily; visit your dentist twice a year.  Depression- Your emotional health is as important as your physical health. If you're feeling down, or losing interest in things you would normally enjoy please talk to your healthcare provider.  Eye exam- Visit your eye doctor  every year.  Safe sex- If you may be exposed to a sexually transmitted infection, use a condom.  Seat belts- Seat belts can save your life; always wear one.  Smoke/Carbon Monoxide detectors- These detectors need to be installed on the appropriate level of your home.  Replace batteries at least once a year.  Skin cancer- When out in the sun, cover up and use sunscreen 15 SPF or higher.  Violence- If anyone is threatening you, please  tell your healthcare provider.  Living Will/ Health care power of attorney- Speak with your healthcare provider and family.     IF you received an x-ray today, you will receive an invoice from [facility]. Please contact [facility] at [phone] with questions or concerns regarding your invoice.   IF you received labwork today, you will receive an invoice from [facility]. Please contact [facility] at [phone] with questions or concerns regarding your invoice.   Our billing staff will not be able to assist you with questions regarding bills from these companies.  You will be contacted with the lab results as soon as they are available. The fastest way to get your results is to activate your My Chart account. Instructions are located on the last page of this paperwork. If you have not heard from us regarding the results in 2 weeks, please contact this office.       I personally performed the services described in this documentation, which was scribed in my presence. The recorded information has been reviewed and considered for accuracy and completeness, addended by me as needed, and agree with information above.  Signed,   Meredith StaggersJeffrey Enza Shone, MD [facility] at [facility].  11/24/17 2:20 PM

## 2017-11-24 LAB — COMPREHENSIVE METABOLIC PANEL
ALT: 26 IU/L (ref 0–44)
AST: 25 IU/L (ref 0–40)
Albumin/Globulin Ratio: 1.7 (ref 1.2–2.2)
Albumin: 4.7 g/dL (ref 3.5–5.5)
Alkaline Phosphatase: 58 IU/L (ref 39–117)
BUN/Creatinine Ratio: 10 (ref 9–20)
BUN: 11 mg/dL (ref 6–24)
Bilirubin Total: 0.6 mg/dL (ref 0.0–1.2)
CALCIUM: 9.8 mg/dL (ref 8.7–10.2)
CHLORIDE: 100 mmol/L (ref 96–106)
CO2: 27 mmol/L (ref 20–29)
Creatinine, Ser: 1.09 mg/dL (ref 0.76–1.27)
GFR calc Af Amer: 88 mL/min/{1.73_m2} (ref >59)
GFR, EST NON AFRICAN AMERICAN: 76 mL/min/{1.73_m2} (ref >59)
Globulin, Total: 2.8 g/dL (ref 1.5–4.5)
Glucose: 98 mg/dL (ref 65–99)
Potassium: 4.4 mmol/L (ref 3.5–5.2)
Sodium: 138 mmol/L (ref 134–144)
Total Protein: 7.5 g/dL (ref 6.0–8.5)

## 2017-11-24 LAB — LIPID PANEL
CHOL/HDL RATIO: 2.9 ratio (ref 0.0–5.0)
Cholesterol, Total: 181 mg/dL (ref 100–199)
HDL: 63 mg/dL (ref >39)
LDL Calculated: 107 mg/dL — ABNORMAL HIGH (ref 0–99)
TRIGLYCERIDES: 54 mg/dL (ref 0–149)
VLDL Cholesterol Cal: 11 mg/dL (ref 5–40)

## 2017-11-24 LAB — PSA: Prostate Specific Ag, Serum: 0.7 ng/mL (ref 0.0–4.0)

## 2017-11-24 LAB — TSH: TSH: 1 u[IU]/mL (ref 0.450–4.500)

## 2017-11-24 MED ORDER — HYDROCHLOROTHIAZIDE 12.5 MG PO CAPS
ORAL_CAPSULE | ORAL | 1 refills | Status: DC
Start: 2017-11-24 — End: 2018-05-24

## 2017-11-30 NOTE — Progress Notes (Signed)
Lab letter sent 

## 2018-05-24 ENCOUNTER — Ambulatory Visit: Payer: BLUE CROSS/BLUE SHIELD | Admitting: Family Medicine

## 2018-05-24 ENCOUNTER — Other Ambulatory Visit: Payer: Self-pay

## 2018-05-24 ENCOUNTER — Encounter: Payer: Self-pay | Admitting: Family Medicine

## 2018-05-24 VITALS — BP 140/80 | HR 86 | Temp 98.4°F | Resp 18 | Ht 70.0 in | Wt 179.6 lb

## 2018-05-24 DIAGNOSIS — M79675 Pain in left toe(s): Secondary | ICD-10-CM

## 2018-05-24 DIAGNOSIS — I10 Essential (primary) hypertension: Secondary | ICD-10-CM

## 2018-05-24 DIAGNOSIS — E785 Hyperlipidemia, unspecified: Secondary | ICD-10-CM | POA: Diagnosis not present

## 2018-05-24 MED ORDER — METOPROLOL SUCCINATE ER 50 MG PO TB24: ORAL_TABLET | ORAL | 1 refills | Status: DC

## 2018-05-24 MED ORDER — HYDROCHLOROTHIAZIDE 12.5 MG PO CAPS: ORAL_CAPSULE | ORAL | 1 refills | Status: DC

## 2018-05-24 NOTE — Progress Notes (Addendum)
Subjective:    Patient ID: Damon Butler, male    DOB: 11/01/1961, 57 y.o.   MRN: [id number]  HPI Damon Butler is a 57 y.o. male Presents today for: Chief Complaint  Patient presents with  . Hypertension    pt is fasting for bloodwork    Hypertension: Currently takes Toprol 50 mg daily, HCTZ 12.5 mg daily. Elevated off meds last visit. Home readings around 130/79-80. No new side effects of meds.   BP Readings from Last 3 Encounters:  05/24/18 140/80  11/23/17 (!) 158/80  05/18/17 134/83   Lab Results  Component Value Date   CREATININE 1.09 11/23/2017   Hyperlipidemia: He was taking fish oil at last visit physical in December 2018. Improved on last readings.  Lab Results  Component Value Date   CHOL 181 11/23/2017   HDL 63 11/23/2017   LDLCALC 107 (H) 11/23/2017   TRIG 54 11/23/2017   CHOLHDL 2.9 11/23/2017   Lab Results  Component Value Date   ALT 26 11/23/2017   AST 25 11/23/2017   ALKPHOS 58 11/23/2017   BILITOT 0.6 11/23/2017   The 10-year ASCVD risk score Denman George(Goff DC Jr., et al., 2013) is: 11.7%   Values used to calculate the score:     Age: [age] years     Sex: Male     Is Non-Hispanic African American: Yes     Diabetic: No     Tobacco smoker: No     Systolic Blood Pressure: 140 mmHg     Is BP treated: Yes     HDL Cholesterol: 63 mg/dL     Total Cholesterol: 181 mg/dL Wt Readings from Last 3 Encounters:  05/24/18 179 lb 9.6 oz (81.5 kg)  11/23/17 183 lb (83 kg)  05/18/17 183 lb 9.6 oz (83.3 kg)    Left pinky toe injured 3 weeks ago, stubbed. Had bruising. Ice and soaks for treatment.  Better, just sore to wear shoe. Wrapped with sports tape. Walking ok. Improving. Was not xrayed.     Patient Active Problem List   Diagnosis Date Noted  . Elevated PSA 10/14/2016  . Framingham cardiac risk <10% in next 10 years 10/14/2016  . 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 OA (osteoarthritis) of knee 03/24/2014  .  Other and unspecified hyperlipidemia 08/12/2013  . Acne 08/12/2013  . HTN (hypertension) 11/12/2012   Past Medical History:  Diagnosis Date  . Allergy   . Anxiety   . Arthritis    OA AND PAIN RT KNEE  . Heart murmur   . Hypertension   . Sinus problem    Past Surgical History:  Procedure Laterality Date  . FRACTURE SURGERY  1982   RT KNEE  . JOINT REPLACEMENT    . KELOID SURGERY     BACK OF NECK  1986 & 1988  . KNEE ARTHROSCOPY Right 11/25/2014   Procedure: RIGHT ARTHROSCOPY KNEE/SYNOVECTOMY;  Surgeon: Loanne DrillingFrank Aluisio V, MD;  Location: [facility];  Service: Orthopedics;  Laterality: Right;  . TOTAL KNEE ARTHROPLASTY Right 03/24/2014   Procedure: RIGHT TOTAL KNEE ARTHROPLASTY WITH HARDWARE REMOVAL;  Surgeon: Loanne DrillingFrank V Aluisio, MD;  Location: [facility];  Service: Orthopedics;  Laterality: Right;   Allergies  Allergen Reactions  . Lisinopril Swelling    LIPS   . Nifedipine Other (See Comments)    Gingival hyperplasia.    Prior to Admission medications   Medication Sig Start Date End Date Taking? Authorizing Provider  aspirin 81 MG tablet  Take 81 mg by mouth daily after lunch.    Yes [provider]  desloratadine (CLARINEX) 5 MG tablet Take 1 tablet (5 mg total) by mouth daily. 11/23/17  Yes Shade Flood, MD  Fish Oil-Cholecalciferol (FISH OIL + D3 PO) Take 1 capsule by mouth daily after lunch.    Yes [provider]  fluticasone (FLONASE) 50 MCG/ACT nasal spray SHAKE LIQUID AND USE 2 SPRAYS IN EACH NOSTRIL DAILY 11/23/17  Yes Shade Flood, MD  hydrochlorothiazide (MICROZIDE) 12.5 MG capsule TAKE 1 CAPSULE(12.5 MG) BY MOUTH DAILY 11/24/17  Yes Shade Flood, MD  metoprolol succinate (TOPROL-XL) 50 MG 24 hr tablet TAKE 1 TABLET BY MOUTH DAILY( TAKE WITH IMMEDIATELY FOLLOWING A MEAL) 11/23/17  Yes Shade Flood, MD  Propylene Glycol (SYSTANE BALANCE OP) Place 1 drop into both eyes daily as needed (lubrication.).   Yes [provider]   Social History    Socioeconomic History  . Marital status: Single    Spouse name: Not on file  . Number of children: Not on file  . Years of education: college  . Highest education level: Not on file  Occupational History  . Occupation: Journalist, newspaper  Social Needs  . Financial resource strain: Not on file  . Food insecurity:    Worry: Not on file    Inability: Not on file  . Transportation needs:    Medical: Not on file    Non-medical: Not on file  Tobacco Use  . Smoking status: Never Smoker  . Smokeless tobacco: Never Used  Substance and Sexual Activity  . Alcohol use: Yes    Alcohol/week: 1.2 oz    Types: 2 Glasses of wine per week    Comment: all drinks-once a week 2 drinks  . Drug use: No  . Sexual activity: Yes    Birth control/protection: Condom    Comment: sex partners in the last 12 months-2  Lifestyle  . Physical activity:    Days per week: Not on file    Minutes per session: Not on file  . Stress: Not on file  Relationships  . Social connections:    Talks on phone: Not on file    Gets together: Not on file    Attends religious service: Not on file    Active member of club or organization: Not on file    Attends meetings of clubs or organizations: Not on file    Relationship status: Not on file  . Intimate partner violence:    Fear of current or ex partner: Not on file    Emotionally abused: Not on file    Physically abused: Not on file    Forced sexual activity: Not on file  Other Topics Concern  . Not on file  Social History Narrative  . Not on file    Review of Systems  Constitutional: Negative for fatigue and unexpected weight change.  Eyes: Negative for visual disturbance.  Respiratory: Negative for cough, chest tightness and shortness of breath.   Cardiovascular: Negative for chest pain, palpitations and leg swelling.  Gastrointestinal: Negative for abdominal pain and blood in stool.  Musculoskeletal: Positive for arthralgias (left small toe).  Skin:  Negative for wound.  Neurological: Negative for dizziness, light-headedness and headaches.       Objective:   Physical Exam  Constitutional: He is oriented to person, place, and time. He appears well-developed and well-nourished.  HENT:  Head: Normocephalic and atraumatic.  Eyes: Pupils are equal, round, and  reactive to light. EOM are normal.  Neck: No JVD present. Carotid bruit is not present.  Cardiovascular: Normal rate, regular rhythm and normal heart sounds.  No murmur heard. Pulmonary/Chest: Effort normal and breath sounds normal. He has no rales.  Musculoskeletal: He exhibits no edema.       Feet:  Neurological: He is alert and oriented to person, place, and time.  Skin: Skin is warm and dry.  Psychiatric: He has a normal mood and affect.  Vitals reviewed.  Vitals:   05/24/18 1324  BP: 140/80  Pulse: 86  Resp: 18  Temp: 98.4 F (36.9 C)  TempSrc: Oral  SpO2: 97%  Weight: 179 lb 9.6 oz (81.5 kg)  Height: 5\' 10"  (1.778 m)       Assessment & Plan:   Damon Butler is a 57 y.o. male Hyperlipidemia, unspecified hyperlipidemia type - Plan: Lipid panel  -Recheck levels, weight has decreased.  Continue to watch diet, exercise.  Does note some decreased appetite at times, plans on Ensure supplement if he is not hungry for other meals.  Advised to follow-up if persistent appetite issues or any worsening symptoms.  Essential hypertension - Plan: Comprehensive metabolic panel, metoprolol succinate (TOPROL-XL) 50 MG 24 hr tablet, hydrochlorothiazide (MICROZIDE) 12.5 MG capsule  -Borderline in office, home readings are improved.  Tolerating current regimen, no changes for now, labs pending  Toe pain, left  -Contusion versus fracture of fifth toe.  Now 3 weeks out from injury, and improving.  Option of x-ray was provided, but declined.  Recommended buddy tape, hard soled shoe, RTC precautions if persistent discomfort.  Meds ordered this encounter  Medications  .  metoprolol succinate (TOPROL-XL) 50 MG 24 hr tablet    Sig: TAKE 1 TABLET BY MOUTH DAILY( TAKE WITH IMMEDIATELY FOLLOWING A MEAL)    Dispense:  90 tablet    Refill:  1  . hydrochlorothiazide (MICROZIDE) 12.5 MG capsule    Sig: TAKE 1 CAPSULE(12.5 MG) BY MOUTH DAILY    Dispense:  90 capsule    Refill:  1   Patient Instructions    Blood pressure looks overall stable today.  I would not recommend any changes at this time.  I will check your cholesterol level again, but weight is decreased, so expect that to continue to improve.  Watch diet, avoid fast foods/fried foods, and some form of physical activity most days per week.  Recheck levels in 6 months.  You could have either bruised or possibly broken your small toe on the left foot.  As that is 44 weeks old at this time and improving, okay to hold off on x-ray at this point.  Would recommend buddy taping to the fourth toe, and hard soled shoe such as a hiking boot or work boot until pain has improved.  If any worsening, return for recheck.    IF you received an x-ray today, you will receive an invoice from [facility]. Please contact [facility] at [phone] with questions or concerns regarding your invoice.   IF you received labwork today, you will receive an invoice from [facility]. Please contact [facility] at [phone] with questions or concerns regarding your invoice.   Our billing staff will not be able to assist you with questions regarding bills from these companies.  You will be contacted with the lab results as soon as they are available. The fastest way to get your results is to activate your My Chart account. Instructions are located on the last page of  this paperwork. If you have not heard from [place] regarding the results in 2 weeks, please contact this office.     Signed,   Meredith Staggers, MD [facility] at [facility].  05/24/18 2:14 PM

## 2018-05-24 NOTE — Patient Instructions (Addendum)
  Blood pressure looks overall stable today.  I would not recommend any changes at this time.  I will check your cholesterol level again, but weight is decreased, so expect that to continue to improve.  Watch diet, avoid fast foods/fried foods, and some form of physical activity most days per week.  Recheck levels in 6 months.  You could have either bruised or possibly broken your small toe on the left foot.  As that is 563 weeks old at this time and improving, okay to hold off on x-ray at this point.  Would recommend buddy taping to the fourth toe, and hard soled shoe such as a hiking boot or work boot until pain has improved.  If any worsening, return for recheck.    IF you received an x-ray today, you will receive an invoice from [facility]. Please contact [facility] at [phone] with questions or concerns regarding your invoice.   IF you received labwork today, you will receive an invoice from [facility]. Please contact [facility] at [phone] with questions or concerns regarding your invoice.   Our billing staff will not be able to assist you with questions regarding bills from these companies.  You will be contacted with the lab results as soon as they are available. The fastest way to get your results is to activate your My Chart account. Instructions are located on the last page of this paperwork. If you have not heard from us regarding the results in 2 weeks, please contact this office.

## 2018-05-25 LAB — COMPREHENSIVE METABOLIC PANEL
ALBUMIN: 4.5 g/dL (ref 3.5–5.5)
ALK PHOS: 57 IU/L (ref 39–117)
ALT: 25 IU/L (ref 0–44)
AST: 22 IU/L (ref 0–40)
Albumin/Globulin Ratio: 1.7 (ref 1.2–2.2)
BILIRUBIN TOTAL: 0.4 mg/dL (ref 0.0–1.2)
BUN / CREAT RATIO: 14 (ref 9–20)
BUN: 14 mg/dL (ref 6–24)
CHLORIDE: 104 mmol/L (ref 96–106)
CO2: 21 mmol/L (ref 20–29)
Calcium: 9.5 mg/dL (ref 8.7–10.2)
Creatinine, Ser: 1.02 mg/dL (ref 0.76–1.27)
GFR calc Af Amer: 95 mL/min/{1.73_m2} (ref >59)
GFR calc non Af Amer: 82 mL/min/{1.73_m2} (ref >59)
GLOBULIN, TOTAL: 2.6 g/dL (ref 1.5–4.5)
GLUCOSE: 114 mg/dL — AB (ref 65–99)
Potassium: 3.9 mmol/L (ref 3.5–5.2)
SODIUM: 141 mmol/L (ref 134–144)
Total Protein: 7.1 g/dL (ref 6.0–8.5)

## 2018-05-25 LAB — LIPID PANEL
CHOLESTEROL TOTAL: 175 mg/dL (ref 100–199)
Chol/HDL Ratio: 3.1 ratio (ref 0.0–5.0)
HDL: 56 mg/dL (ref >39)
LDL Calculated: 109 mg/dL — ABNORMAL HIGH (ref 0–99)
TRIGLYCERIDES: 48 mg/dL (ref 0–149)
VLDL Cholesterol Cal: 10 mg/dL (ref 5–40)

## 2018-12-04 ENCOUNTER — Other Ambulatory Visit: Payer: Self-pay

## 2018-12-04 ENCOUNTER — Ambulatory Visit (INDEPENDENT_AMBULATORY_CARE_PROVIDER_SITE_OTHER): Payer: BLUE CROSS/BLUE SHIELD | Admitting: Family Medicine

## 2018-12-04 ENCOUNTER — Encounter: Payer: Self-pay | Admitting: Family Medicine

## 2018-12-04 VITALS — BP 148/84 | HR 78 | Temp 98.6°F | Resp 16 | Ht 70.28 in | Wt 177.0 lb

## 2018-12-04 DIAGNOSIS — Z0001 Encounter for general adult medical examination with abnormal findings: Secondary | ICD-10-CM

## 2018-12-04 DIAGNOSIS — Z125 Encounter for screening for malignant neoplasm of prostate: Secondary | ICD-10-CM

## 2018-12-04 DIAGNOSIS — F439 Reaction to severe stress, unspecified: Secondary | ICD-10-CM

## 2018-12-04 DIAGNOSIS — E785 Hyperlipidemia, unspecified: Secondary | ICD-10-CM

## 2018-12-04 DIAGNOSIS — Z0000 Encounter for general adult medical examination without abnormal findings: Secondary | ICD-10-CM

## 2018-12-04 DIAGNOSIS — R739 Hyperglycemia, unspecified: Secondary | ICD-10-CM

## 2018-12-04 DIAGNOSIS — Z1329 Encounter for screening for other suspected endocrine disorder: Secondary | ICD-10-CM | POA: Diagnosis not present

## 2018-12-04 DIAGNOSIS — I10 Essential (primary) hypertension: Secondary | ICD-10-CM

## 2018-12-04 DIAGNOSIS — G4700 Insomnia, unspecified: Secondary | ICD-10-CM

## 2018-12-04 MED ORDER — METOPROLOL SUCCINATE ER 50 MG PO TB24: ORAL_TABLET | ORAL | 1 refills | Status: DC

## 2018-12-04 MED ORDER — TRAZODONE HCL 50 MG PO TABS: 25.0000 mg | ORAL_TABLET | Freq: Every evening | ORAL | 3 refills | Status: DC | PRN

## 2018-12-04 MED ORDER — HYDROCHLOROTHIAZIDE 12.5 MG PO CAPS: ORAL_CAPSULE | ORAL | 1 refills | Status: DC

## 2018-12-04 NOTE — Progress Notes (Signed)
Subjective:    Patient ID: Damon Butler, male    DOB: August 04, 1961, 57 y.o.   MRN: [id number]  HPI Damon Butler is a 57 y.o. male Presents today for: Chief Complaint  Patient presents with  . Annual Exam   Hypertension: BP Readings from Last 3 Encounters:  12/04/18 (!) 148/84  05/24/18 140/80  11/23/17 (!) 158/80   Lab Results  Component Value Date   CREATININE 1.02 05/24/2018  Takes HCTZ 12.5 mg daily, Toprol-XL 50 mg daily.  Aspirin 81 mg daily. No missed doses. Anxious to be in office.  Home BP 130's/70-80. No new side effects.  No bleeding. Discussed r/b of ASA use.   History of elevated PSA: Normal last year at 0.7  Hyperlipidemia:  Lab Results  Component Value Date   CHOL 175 05/24/2018   HDL 56 05/24/2018   LDLCALC 109 (H) 05/24/2018   TRIG 48 05/24/2018   CHOLHDL 3.1 05/24/2018   Lab Results  Component Value Date   ALT 25 05/24/2018   AST 22 05/24/2018   ALKPHOS 57 05/24/2018   BILITOT 0.4 05/24/2018  plan for diet, exercise, recheck 6 months Exercising more and some diet changes since last time.   Hyperglycemia: Glucose 114,  6 months ago.   Wt Readings from Last 3 Encounters:  12/04/18 177 lb (80.3 kg)  05/24/18 179 lb 9.6 oz (81.5 kg)  11/23/17 183 lb (83 kg)   Lab Results  Component Value Date   HGBA1C 5.6 11/26/2012   Colonoscopy 12/19/2009, repeat 10 years. PSA normal last year. Agrees to test today.   Immunization History  Administered Date(s) Administered  . Tdap 02/25/2013  flu vaccine - has not had - refuses  Depression screen [facility] 2/9 12/04/2018 05/24/2018 11/23/2017 05/18/2017 11/17/2016  Decreased Interest 1 0 0 0 0  Down, Depressed, Hopeless 1 0 0 0 0  PHQ - 2 Score 2 0 0 0 0  Altered sleeping 0 - - - -  Tired, decreased energy 1 - - - -  Change in appetite 0 - - - -  Feeling bad or failure about yourself  0 - - - -  Trouble concentrating 0 - - - -  Moving slowly or fidgety/restless 0 - - - -  Suicidal thoughts  0 - - - -  PHQ-9 Score 3 - - - -  Positive depression screening as above. Occasionally feeling down. Broken toe earlier this year. Work has slowed down some. Mom and dad getting older, and as the oldest child helps to get them to appts. Feeling stressed at times. Trying to be up but some down days. Some anxiety today. Uncle passed away 2 days ago.  Coping technique: chill/relax. Some trouble relaxing, trouble getting to sleep at times off and on for years. No recent meds. Denies SI.    Visual Acuity Screening   Right eye Left eye Both eyes  Without correction: 20/20 20/20 20/20   With correction:     no recent eval - recommended.   Dental: every 6 months.   Exercise: walking, squats, stretches at home. Most days.   Patient Active Problem List   Diagnosis Date Noted  . Elevated PSA 10/14/2016  . Framingham cardiac risk <10% in next 10 years 10/14/2016  . 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 OA (osteoarthritis) of knee 03/24/2014  . Other and unspecified hyperlipidemia 08/12/2013  . Acne 08/12/2013  . HTN (hypertension) 11/12/2012   Past Medical History:  Diagnosis Date  .  Allergy   . Anxiety   . Arthritis    OA AND PAIN RT KNEE  . Heart murmur   . Hypertension   . Sinus problem    Past Surgical History:  Procedure Laterality Date  . FRACTURE SURGERY  1982   RT [place]  . KNEE ARTHROSCOPY Right 11/25/2014   Procedure: RIGHT ARTHROSCOPY KNEE/SYNOVECTOMY;  Surgeon: Gearlean Alf, MD;  Location: [facility];  Service: Orthopedics;  Laterality: Right;  . TOTAL KNEE ARTHROPLASTY Right 03/24/2014   Procedure: RIGHT TOTAL KNEE ARTHROPLASTY WITH HARDWARE REMOVAL;  Surgeon: Gearlean Alf, MD;  Location: [facility];  Service: Orthopedics;  Laterality: Right;   Allergies  Allergen Reactions  . Lisinopril Swelling    LIPS   . Nifedipine Other (See Comments)    Gingival hyperplasia.     Prior to Admission medications   Medication Sig Start Date End Date Taking? Authorizing Provider  aspirin 81 MG tablet Take 81 mg by mouth daily after lunch.    Yes [provider]  desloratadine (CLARINEX) 5 MG tablet Take 1 tablet (5 mg total) by mouth daily. 11/23/17  Yes Wendie Agreste, MD  Fish Oil-Cholecalciferol (FISH OIL + D3 PO) Take 1 capsule by mouth daily after lunch.    Yes [provider]  fluticasone (FLONASE) 50 MCG/ACT nasal spray SHAKE LIQUID AND USE 2 SPRAYS IN EACH NOSTRIL DAILY 11/23/17  Yes Wendie Agreste, MD  hydrochlorothiazide (MICROZIDE) 12.5 MG capsule TAKE 1 CAPSULE(12.5 MG) BY MOUTH DAILY 05/24/18  Yes Wendie Agreste, MD  metoprolol succinate (TOPROL-XL) 50 MG 24 hr tablet TAKE 1 TABLET BY MOUTH DAILY( TAKE WITH IMMEDIATELY FOLLOWING A MEAL) 05/24/18  Yes Wendie Agreste, MD  Propylene Glycol (SYSTANE BALANCE OP) Place 1 drop into both eyes daily as needed (lubrication.).   Yes [provider]   Social History   Socioeconomic History  . Marital status: Single    Spouse name: Not on file  . Number of children: Not on file  . Years of education: college  . Highest education level: Not on file  Occupational History  . Occupation: Cabin crew  Social Needs  . Financial resource strain: Not on file  . Food insecurity:    Worry: Not on file    Inability: Not on file  . Transportation needs:    Medical: Not on file    Non-medical: Not on file  Tobacco Use  . Smoking status: Never Smoker  . Smokeless tobacco: Never Used  Substance and Sexual Activity  . Alcohol use: Yes    Alcohol/week: 2.0 standard drinks    Types: 2 Glasses of wine per week    Comment: all drinks-once a week 2 drinks  . Drug use: No  . Sexual activity: Yes    Birth control/protection: Condom    Comment: sex partners in the last 17 months-2  Lifestyle  . Physical activity:    Days per week: Not on file    Minutes per session: Not on file  .  Stress: Not on file  Relationships  . Social connections:    Talks on phone: Not on file    Gets together: Not on file    Attends religious service: Not on file    Active member of club or organization: Not on file    Attends meetings of clubs or organizations: Not on file  Relationship status: Not on file  . Intimate partner violence:    Fear of current or ex partner: Not on file    Emotionally abused: Not on file    Physically abused: Not on file    Forced sexual activity: Not on file  Other Topics Concern  . Not on file  Social History Narrative  . Not on file    Review of Systems Seasonal allergies,  13 point review of systems per patient health survey noted.  Negative other than as indicated above or in HPI.      Objective:   Physical Exam Vitals signs and nursing note reviewed.  Constitutional:      Appearance: He is well-developed.  HENT:     Head: Normocephalic and atraumatic.     Right Ear: External ear normal.     Left Ear: External ear normal.  Eyes:     Conjunctiva/sclera: Conjunctivae normal.     Pupils: Pupils are equal, round, and reactive to light.  Neck:     Musculoskeletal: Normal range of motion and neck supple.     Thyroid: No thyromegaly.  Cardiovascular:     Rate and Rhythm: Normal rate and regular rhythm.     Heart sounds: Normal heart sounds.  Pulmonary:     Effort: Pulmonary effort is normal. No respiratory distress.     Breath sounds: Normal breath sounds. No wheezing.  Abdominal:     General: There is no distension.     Palpations: Abdomen is soft.     Tenderness: There is no abdominal tenderness.     Hernia: There is no hernia in the right inguinal area or left inguinal area.  Genitourinary:    Prostate: Normal.  Musculoskeletal: Normal range of motion.        General: No tenderness.  Lymphadenopathy:     Cervical: No cervical adenopathy.  Skin:    General: Skin is warm and dry.  Neurological:     Mental Status: He is alert and  oriented to person, place, and time.     Deep Tendon Reflexes: Reflexes are normal and symmetric.  Psychiatric:        Mood and Affect: Mood normal.        Behavior: Behavior normal.        Thought Content: Thought content normal.    Vitals:   12/04/18 1329  BP: (!) 148/84  Pulse: 78  Resp: 16  Temp: 98.6 F (37 C)  TempSrc: Oral  SpO2: 98%  Weight: 177 lb (80.3 kg)  Height: 5' 10.28" (1.785 m)         Assessment & Plan:    ZAYLYN BERGDOLL is a 57 y.o. male Annual physical exam  - -anticipatory guidance as below in AVS, screening labs above. Health maintenance items as above in HPI discussed/recommended as applicable.   Screening for thyroid disorder - Plan: TSH  Screening for prostate cancer - Plan: PSA  - We discussed pros and cons of prostate cancer screening, and after this discussion, he chose to have screening done. PSA obtained, and no concerning findings on DRE.   Situational stress - Plan: traZODone (DESYREL) 50 MG tablet Insomnia, unspecified type - Plan: traZODone (DESYREL) 50 MG tablet  -Suspected situational stressors but has some component of depressed mood with insomnia.  Trial of low-dose trazodone at bedtime, handout given, RTC precautions with recheck in 6 weeks.  Hyperglycemia - Plan: Comprehensive metabolic panel, Hemoglobin A1c  -Screen for prediabetes/diabetes.  Hyperlipidemia, unspecified hyperlipidemia type -  Plan: Comprehensive metabolic panel, Lipid panel  -Check labs, diet adherence.  Essential hypertension - Plan: hydrochlorothiazide (MICROZIDE) 12.5 MG capsule, metoprolol succinate (TOPROL-XL) 50 MG 24 hr tablet  -Elevated in office, monitor home readings, RTC precautions if elevated.  Plan to recheck in 6 weeks.  Meds ordered this encounter  Medications  . traZODone (DESYREL) 50 MG tablet    Sig: Take 0.5-1 tablets (25-50 mg total) by mouth at bedtime as needed for sleep.    Dispense:  30 tablet    Refill:  3  .  hydrochlorothiazide (MICROZIDE) 12.5 MG capsule    Sig: TAKE 1 CAPSULE(12.5 MG) BY MOUTH DAILY    Dispense:  90 capsule    Refill:  1  . metoprolol succinate (TOPROL-XL) 50 MG 24 hr tablet    Sig: TAKE 1 TABLET BY MOUTH DAILY( TAKE WITH IMMEDIATELY FOLLOWING A MEAL)    Dispense:  90 tablet    Refill:  1   Patient Instructions   Try trazodone once at night for sleep, and see other info on stress below.  Follow up in next 6 weeks to recheck these symptoms. Return to the clinic or go to the nearest emergency room if any of your symptoms worsen or new symptoms occur.   Keep a record of your blood pressures outside of the office and bring them to the next office visit. If over 140/90 outside of office return to adjust meds.  Thanks for coming in today.   Keeping you healthy  Get these tests  Blood pressure- Have your blood pressure checked once a year by your healthcare provider.  Normal blood pressure is 120/80  Weight- Have your body mass index (BMI) calculated to screen for obesity.  BMI is a measure of body fat based on height and weight. You can also calculate your own BMI at [email or web address].  Cholesterol- Have your cholesterol checked every year.  Diabetes- Have your blood sugar checked regularly if you have high blood pressure, high cholesterol, have a family history of diabetes or if you are overweight.  Screening for Colon Cancer- Colonoscopy starting at age 37.  Screening may begin sooner depending on your family history and other health conditions. Follow up colonoscopy as directed by your Gastroenterologist.  Screening for Prostate Cancer- Both blood work (PSA) and a rectal exam help screen for Prostate Cancer.  Screening begins at age 64 with African-American men and at age 88 with Caucasian men.  Screening may begin sooner depending on your family history.  Take these medicines  Aspirin- One aspirin daily can help prevent Heart disease and Stroke.  Flu shot-  Every fall.  Tetanus- Every 10 years.  Zostavax- Once after the age of 25 to prevent Shingles.  Pneumonia shot- Once after the age of 86; if you are younger than 58, ask your healthcare provider if you need a Pneumonia shot.  Take these steps  Don't smoke- If you do smoke, talk to your doctor about quitting.  For tips on how to quit, go to www.smokefree.gov or call 1-800-QUIT-NOW.  Be physically active- Exercise 5 days a week for at least 30 minutes.  If you are not already physically active start slow and gradually work up to 30 minutes of moderate physical activity.  Examples of moderate activity include walking briskly, mowing the yard, dancing, swimming, bicycling, etc.  Eat a healthy diet- Eat a variety of healthy food such as fruits, vegetables, low fat milk, low fat cheese, yogurt, lean meant, poultry, fish, beans, tofu, etc.  For more information go to www.thenutritionsource.org  Drink alcohol in moderation- Limit alcohol intake to less than two drinks a day. Never drink and drive.  Dentist- Brush and floss twice daily; visit your dentist twice a year.  Depression- Your emotional health is as important as your physical health. If you're feeling down, or losing interest in things you would normally enjoy please talk to your healthcare provider.  Eye exam- Visit your eye doctor every year.  Safe sex- If you may be exposed to a sexually transmitted infection, use a condom.  Seat belts- Seat belts can save your life; always wear one.  Smoke/Carbon Monoxide detectors- These detectors need to be installed on the appropriate level of your home.  Replace batteries at least once a year.  Skin cancer- When out in the sun, cover up and use sunscreen 15 SPF or higher.  Violence- If anyone is threatening you, please tell your healthcare provider.  Living Will/ Health care power of attorney- Speak with your healthcare provider and family.  Stress and Stress Management Stress is a normal  reaction to life events. It is what you feel when life demands more than you are used to or more than you can handle. Some stress can be useful. For example, the stress reaction can help you catch the last bus of the day, study for a test, or meet a deadline at work. But stress that occurs too often or for too long can cause problems. It can affect your emotional health and interfere with relationships and normal daily activities. Too much stress can weaken your immune system and increase your risk for physical illness. If you already have a medical problem, stress can make it worse. What are the causes? All sorts of life events may cause stress. An event that causes stress for one person may not be stressful for another person. Major life events commonly cause stress. These may be positive or negative. Examples include losing your job, moving into a new home, getting married, having a baby, or losing a loved one. Less obvious life events may also cause stress, especially if they occur day after day or in combination. Examples include working long hours, driving in traffic, caring for children, being in debt, or being in a difficult relationship. What are the signs or symptoms? Stress may cause emotional symptoms including, the following:  Anxiety. This is feeling worried, afraid, on edge, overwhelmed, or out of control.  Anger. This is feeling irritated or impatient.  Depression. This is feeling sad, down, helpless, or guilty.  Difficulty focusing, remembering, or making decisions.  Stress may cause physical symptoms, including the following:  Aches and pains. These may affect your head, neck, back, stomach, or other areas of your body.  Tight muscles or clenched jaw.  Low energy or trouble sleeping.  Stress may cause unhealthy behaviors, including the following:  Eating to feel better (overeating) or skipping meals.  Sleeping too little, too much, or both.  Working too much or putting  off tasks (procrastination).  Smoking, drinking alcohol, or using drugs to feel better.  How is this diagnosed? Stress is diagnosed through an assessment by your health care provider. Your health care provider will ask questions about your symptoms and any stressful life events.Your health care provider will also ask about your medical history and may order blood tests or other tests. Certain medical conditions and medicine can cause physical symptoms similar to stress. Mental illness can cause emotional symptoms and unhealthy behaviors similar to  stress. Your health care provider may refer you to a mental health professional for further evaluation. How is this treated? Stress management is the recommended treatment for stress.The goals of stress management are reducing stressful life events and coping with stress in healthy ways. Techniques for reducing stressful life events include the following:  Stress identification. Self-monitor for stress and identify what causes stress for you. These skills may help you to avoid some stressful events.  Time management. Set your priorities, keep a calendar of events, and learn to say "no." These tools can help you avoid making too many commitments.  Techniques for coping with stress include the following:  Rethinking the problem. Try to think realistically about stressful events rather than ignoring them or overreacting. Try to find the positives in a stressful situation rather than focusing on the negatives.  Exercise. Physical exercise can release both physical and emotional tension. The key is to find a form of exercise you enjoy and do it regularly.  Relaxation techniques. These relax the body and mind. Examples include yoga, meditation, tai chi, biofeedback, deep breathing, progressive muscle relaxation, listening to music, being out in nature, journaling, and other hobbies. Again, the key is to find one or more that you enjoy and can do  regularly.  Healthy lifestyle. Eat a balanced diet, get plenty of sleep, and do not smoke. Avoid using alcohol or drugs to relax.  Strong support network. Spend time with family, friends, or other people you enjoy being around.Express your feelings and talk things over with someone you trust.  Counseling or talktherapy with a mental health professional may be helpful if you are having difficulty managing stress on your own. Medicine is typically not recommended for the treatment of stress.Talk to your health care provider if you think you need medicine for symptoms of stress. Follow these instructions at home:  Keep all follow-up visits as directed by your health care provider.  Take all medicines as directed by your health care provider. Contact a health care provider if:  Your symptoms get worse or you start having new symptoms.  You feel overwhelmed by your problems and can no longer manage them on your own. Get help right away if:  You feel like hurting yourself or someone else. This information is not intended to replace advice given to you by your health care provider. Make sure you discuss any questions you have with your health care provider. Document Released: 05/31/2001 Document Revised: 05/12/2016 Document Reviewed: 07/30/2013 Elsevier Interactive Patient Education  [employer].       If you have lab work done today you will be contacted with your lab results within the next 2 weeks.  If you have not heard from [place] then please contact us. The fastest way to get your results is to register for My Chart.   IF you received an x-ray today, you will receive an invoice from [facility]. Please contact [facility] at [phone] with questions or concerns regarding your invoice.   IF you received labwork today, you will receive an invoice from [facility]. Please contact [facility] at [phone] with questions or concerns regarding your invoice.   Our  billing staff will not be able to assist you with questions regarding bills from these companies.  You will be contacted with the lab results as soon as they are available. The fastest way to get your results is to activate your My Chart account. Instructions are located on the last page of this paperwork. If  you have not heard from [place] regarding the results in 2 weeks, please contact this office.       Signed,   Merri Ray, MD [facility].  12/10/18 8:51 AM

## 2018-12-04 NOTE — Patient Instructions (Addendum)
Try trazodone once at night for sleep, and see other info on stress below.  Follow up in next 6 weeks to recheck these symptoms. Return to the clinic or go to the nearest emergency room if any of your symptoms worsen or new symptoms occur.   Keep a record of your blood pressures outside of the office and bring them to the next office visit. If over 140/90 outside of office return to adjust meds.  Thanks for coming in today.   Keeping you healthy  Get these tests  Blood pressure- Have your blood pressure checked once a year by your healthcare provider.  Normal blood pressure is 120/80  Weight- Have your body mass index (BMI) calculated to screen for obesity.  BMI is a measure of body fat based on height and weight. You can also calculate your own BMI at View[employer].  Cholesterol- Have your cholesterol checked every year.  Diabetes- Have your blood sugar checked regularly if you have high blood pressure, high cholesterol, have a family history of diabetes or if you are overweight.  Screening for Colon Cancer- Colonoscopy starting at age 8.  Screening may begin sooner depending on your family history and other health conditions. Follow up colonoscopy as directed by your Gastroenterologist.  Screening for Prostate Cancer- Both blood work (PSA) and a rectal exam help screen for Prostate Cancer.  Screening begins at age 57 with African-American men and at age 18 with Caucasian men.  Screening may begin sooner depending on your family history.  Take these medicines  Aspirin- One aspirin daily can help prevent Heart disease and Stroke.  Flu shot- Every fall.  Tetanus- Every 10 years.  Zostavax- Once after the age of 83 to prevent Shingles.  Pneumonia shot- Once after the age of 91; if you are younger than 27, ask your healthcare provider if you need a Pneumonia shot.  Take these steps  Don't smoke- If you do smoke, talk to your doctor about quitting.  For tips on how to  quit, go to www.smokefree.gov or call 1-800-QUIT-NOW.  Be physically active- Exercise 5 days a week for at least 30 minutes.  If you are not already physically active start slow and gradually work up to 30 minutes of moderate physical activity.  Examples of moderate activity include walking briskly, mowing the yard, dancing, swimming, bicycling, etc.  Eat a healthy diet- Eat a variety of healthy food such as fruits, vegetables, low fat milk, low fat cheese, yogurt, lean meant, poultry, fish, beans, tofu, etc. For more information go to www.thenutritionsource.org  Drink alcohol in moderation- Limit alcohol intake to less than two drinks a day. Never drink and drive.  Dentist- Brush and floss twice daily; visit your dentist twice a year.  Depression- Your emotional health is as important as your physical health. If you're feeling down, or losing interest in things you would normally enjoy please talk to your healthcare provider.  Eye exam- Visit your eye doctor every year.  Safe sex- If you may be exposed to a sexually transmitted infection, use a condom.  Seat belts- Seat belts can save your life; always wear one.  Smoke/Carbon Monoxide detectors- These detectors need to be installed on the appropriate level of your home.  Replace batteries at least once a year.  Skin cancer- When out in the sun, cover up and use sunscreen 15 SPF or higher.  Violence- If anyone is threatening you, please tell your healthcare provider.  Living Will/ Health care power of attorney- Speak  with your healthcare provider and family.  Stress and Stress Management Stress is a normal reaction to life events. It is what you feel when life demands more than you are used to or more than you can handle. Some stress can be useful. For example, the stress reaction can help you catch the last bus of the day, study for a test, or meet a deadline at work. But stress that occurs too often or for too long can cause problems. It  can affect your emotional health and interfere with relationships and normal daily activities. Too much stress can weaken your immune system and increase your risk for physical illness. If you already have a medical problem, stress can make it worse. What are the causes? All sorts of life events may cause stress. An event that causes stress for one person may not be stressful for another person. Major life events commonly cause stress. These may be positive or negative. Examples include losing your job, moving into a new home, getting married, having a baby, or losing a loved one. Less obvious life events may also cause stress, especially if they occur day after day or in combination. Examples include working long hours, driving in traffic, caring for children, being in debt, or being in a difficult relationship. What are the signs or symptoms? Stress may cause emotional symptoms including, the following:  Anxiety. This is feeling worried, afraid, on edge, overwhelmed, or out of control.  Anger. This is feeling irritated or impatient.  Depression. This is feeling sad, down, helpless, or guilty.  Difficulty focusing, remembering, or making decisions.  Stress may cause physical symptoms, including the following:  Aches and pains. These may affect your head, neck, back, stomach, or other areas of your body.  Tight muscles or clenched jaw.  Low energy or trouble sleeping.  Stress may cause unhealthy behaviors, including the following:  Eating to feel better (overeating) or skipping meals.  Sleeping too little, too much, or both.  Working too much or putting off tasks (procrastination).  Smoking, drinking alcohol, or using drugs to feel better.  How is this diagnosed? Stress is diagnosed through an assessment by your health care provider. Your health care provider will ask questions about your symptoms and any stressful life events.Your health care provider will also ask about your  medical history and may order blood tests or other tests. Certain medical conditions and medicine can cause physical symptoms similar to stress. Mental illness can cause emotional symptoms and unhealthy behaviors similar to stress. Your health care provider may refer you to a mental health professional for further evaluation. How is this treated? Stress management is the recommended treatment for stress.The goals of stress management are reducing stressful life events and coping with stress in healthy ways. Techniques for reducing stressful life events include the following:  Stress identification. Self-monitor for stress and identify what causes stress for you. These skills may help you to avoid some stressful events.  Time management. Set your priorities, keep a calendar of events, and learn to say "no." These tools can help you avoid making too many commitments.  Techniques for coping with stress include the following:  Rethinking the problem. Try to think realistically about stressful events rather than ignoring them or overreacting. Try to find the positives in a stressful situation rather than focusing on the negatives.  Exercise. Physical exercise can release both physical and emotional tension. The key is to find a form of exercise you enjoy and do it regularly.  Relaxation techniques. These relax the body and mind. Examples include yoga, meditation, tai chi, biofeedback, deep breathing, progressive muscle relaxation, listening to music, being out in nature, journaling, and other hobbies. Again, the key is to find one or more that you enjoy and can do regularly.  Healthy lifestyle. Eat a balanced diet, get plenty of sleep, and do not smoke. Avoid using alcohol or drugs to relax.  Strong support network. Spend time with family, friends, or other people you enjoy being around.Express your feelings and talk things over with someone you trust.  Counseling or talktherapy with a mental  health professional may be helpful if you are having difficulty managing stress on your own. Medicine is typically not recommended for the treatment of stress.Talk to your health care provider if you think you need medicine for symptoms of stress. Follow these instructions at home:  Keep all follow-up visits as directed by your health care provider.  Take all medicines as directed by your health care provider. Contact a health care provider if:  Your symptoms get worse or you start having new symptoms.  You feel overwhelmed by your problems and can no longer manage them on your own. Get help right away if:  You feel like hurting yourself or someone else. This information is not intended to replace advice given to you by your health care provider. Make sure you discuss any questions you have with your health care provider. Document Released: 05/31/2001 Document Revised: 05/12/2016 Document Reviewed: 07/30/2013 Elsevier Interactive Patient Education  [employer].       If you have lab work done today you will be contacted with your lab results within the next 2 weeks.  If you have not heard from [place] then please contact us. The fastest way to get your results is to register for My Chart.   IF you received an x-ray today, you will receive an invoice from [facility]. Please contact [facility] at [phone] with questions or concerns regarding your invoice.   IF you received labwork today, you will receive an invoice from [facility]. Please contact [facility] at [phone] with questions or concerns regarding your invoice.   Our billing staff will not be able to assist you with questions regarding bills from these companies.  You will be contacted with the lab results as soon as they are available. The fastest way to get your results is to activate your My Chart account. Instructions are located on the last page of this paperwork. If you have not heard from [place]  regarding the results in 2 weeks, please contact this office.

## 2018-12-05 LAB — COMPREHENSIVE METABOLIC PANEL
A/G RATIO: 1.9 (ref 1.2–2.2)
ALBUMIN: 4.7 g/dL (ref 3.5–5.5)
ALT: 26 IU/L (ref 0–44)
AST: 20 IU/L (ref 0–40)
Alkaline Phosphatase: 57 IU/L (ref 39–117)
BILIRUBIN TOTAL: 0.7 mg/dL (ref 0.0–1.2)
BUN / CREAT RATIO: 12 (ref 9–20)
BUN: 12 mg/dL (ref 6–24)
CHLORIDE: 100 mmol/L (ref 96–106)
CO2: 24 mmol/L (ref 20–29)
Calcium: 9.9 mg/dL (ref 8.7–10.2)
Creatinine, Ser: 1.04 mg/dL (ref 0.76–1.27)
GFR calc non Af Amer: 80 mL/min/{1.73_m2} (ref >59)
GFR, EST AFRICAN AMERICAN: 92 mL/min/{1.73_m2} (ref >59)
Globulin, Total: 2.5 g/dL (ref 1.5–4.5)
Glucose: 103 mg/dL — ABNORMAL HIGH (ref 65–99)
POTASSIUM: 4.2 mmol/L (ref 3.5–5.2)
SODIUM: 139 mmol/L (ref 134–144)
TOTAL PROTEIN: 7.2 g/dL (ref 6.0–8.5)

## 2018-12-05 LAB — HEMOGLOBIN A1C
ESTIMATED AVERAGE GLUCOSE: 123 mg/dL
Hgb A1c MFr Bld: 5.9 % — ABNORMAL HIGH (ref 4.8–5.6)

## 2018-12-05 LAB — LIPID PANEL
Chol/HDL Ratio: 3 ratio (ref 0.0–5.0)
Cholesterol, Total: 167 mg/dL (ref 100–199)
HDL: 55 mg/dL (ref >39)
LDL Calculated: 100 mg/dL — ABNORMAL HIGH (ref 0–99)
Triglycerides: 60 mg/dL (ref 0–149)
VLDL Cholesterol Cal: 12 mg/dL (ref 5–40)

## 2018-12-05 LAB — PSA: PROSTATE SPECIFIC AG, SERUM: 0.4 ng/mL (ref 0.0–4.0)

## 2018-12-05 LAB — TSH: TSH: 1.04 u[IU]/mL (ref 0.450–4.500)

## 2018-12-17 ENCOUNTER — Encounter: Payer: Self-pay | Admitting: *Deleted

## 2019-04-14 ENCOUNTER — Other Ambulatory Visit: Payer: Self-pay | Admitting: Family Medicine

## 2019-04-14 DIAGNOSIS — J309 Allergic rhinitis, unspecified: Secondary | ICD-10-CM

## 2019-07-08 ENCOUNTER — Other Ambulatory Visit: Payer: Self-pay | Admitting: Family Medicine

## 2019-07-08 DIAGNOSIS — I10 Essential (primary) hypertension: Secondary | ICD-10-CM

## 2019-07-08 NOTE — Telephone Encounter (Signed)
Call to patient- left message for patient to call office for medication follow up appointment. 30 day courtesy refill given on both BP medications.

## 2019-08-07 ENCOUNTER — Other Ambulatory Visit: Payer: Self-pay | Admitting: Family Medicine

## 2019-08-07 DIAGNOSIS — I10 Essential (primary) hypertension: Secondary | ICD-10-CM

## 2022-11-07 ENCOUNTER — Encounter: Payer: Self-pay | Admitting: Family

## 2022-11-07 ENCOUNTER — Ambulatory Visit (INDEPENDENT_AMBULATORY_CARE_PROVIDER_SITE_OTHER): Payer: Commercial Managed Care - HMO | Admitting: Family

## 2022-11-07 VITALS — BP 160/95 | HR 94 | Temp 97.8°F | Resp 18 | Ht 70.28 in | Wt 175.1 lb

## 2022-11-07 DIAGNOSIS — Z125 Encounter for screening for malignant neoplasm of prostate: Secondary | ICD-10-CM

## 2022-11-07 DIAGNOSIS — I10 Essential (primary) hypertension: Secondary | ICD-10-CM | POA: Diagnosis not present

## 2022-11-07 DIAGNOSIS — Z7689 Persons encountering health services in other specified circumstances: Secondary | ICD-10-CM | POA: Diagnosis not present

## 2022-11-07 DIAGNOSIS — Z13220 Encounter for screening for lipoid disorders: Secondary | ICD-10-CM

## 2022-11-07 MED ORDER — HYDROCHLOROTHIAZIDE 12.5 MG PO CAPS: 12.5000 mg | ORAL_CAPSULE | Freq: Every day | ORAL | 3 refills | Status: DC

## 2022-11-07 NOTE — Progress Notes (Signed)
Provider: Keyron Pokorski FNP-C   Shade Flood, MD  Patient Care Team: Shade Flood, MD as PCP - General (Family Medicine)  Extended Emergency Contact Information Primary Emergency Contact: Miki Kins. Address: [address]          [place] Home Phone: [phone] Mobile Phone: [phone] Relation: Father Secondary Emergency Contact: Kalman Drape Address: [address]          [place], [place] [address] [place] Home Phone: [phone] Relation: Friend Interpreter needed? No  Code Status:  Full Cod e Goals of care: Advanced Directive information    11/07/2022   11:14 AM  Advanced Directives  Does Patient Have a Medical Advance Directive? No  Would patient like information on creating a medical advance directive? Yes (Inpatient - patient defers creating a medical advance directive at this time - Information given)     Chief Complaint  Patient presents with   Establish Care    New Patient.     HPI:  Pt is a 61 y.o. male seen today establish care here at [facility] for medical management of chronic diseases  States reacted to lisinopril and Nifedipine due to swelling on the legs and lips. He was started on HZCT and metoprolol but states has weaned himself off. Currently off blood pressure medication. Has been drinking apple cider vinegar one tablespoon once daily.states B/p running in the 130's/80's.  B/p elevated today though attributes to being anxious coming in today.    Due for shingles,COVID-19 vaccine and Influenza vaccine.He declines Influenza vaccine.Made aware to get shingles and COVID-19 at the pharmacy.  He does not do any form of exercise. Tries to eat more salad and drink more water.   Has not had any lab work since 2019 due to COVID-19.   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     Allergies as of 11/07/2022       Reactions   Lisinopril Swelling   LIPS    Nifedipine Other (See Comments)   Gingival hyperplasia.         Medication List        Accurate as of November 07, 2022 11:36 AM. If you have any questions, ask your nurse or doctor.          STOP taking these medications    desloratadine 5 MG tablet Commonly known as: Clarinex Stopped by: Caesar Bookman, NP   hydrochlorothiazide 12.5 MG capsule Commonly known  as: MICROZIDE Stopped by: Caesar Bookman, NP   metoprolol succinate 50 MG 24 hr tablet Commonly known as: TOPROL-XL Stopped by: Caesar Bookman, NP   traZODone 50 MG tablet Commonly known as: DESYREL Stopped by: Caesar Bookman, NP       TAKE these medications    APPLE CIDER VINEGAR PO Take by mouth daily. 1.5 tablespoon.   aspirin 81 MG tablet Take 81 mg by mouth daily after lunch.   FISH OIL + D3 PO Take 1 capsule by mouth daily after lunch.   fluticasone 50 MCG/ACT nasal spray Commonly known as: FLONASE SHAKE LIQUID AND USE 2 SPRAYS IN EACH NOSTRIL DAILY   SYSTANE BALANCE OP Place 1 drop into both eyes daily as needed (lubrication.).        Review of Systems  Constitutional:   Negative for appetite change, chills, fatigue, fever and unexpected weight change.  HENT:  Negative for congestion, dental problem, ear discharge, ear pain, facial swelling, hearing loss, nosebleeds, postnasal drip, rhinorrhea, sinus pressure, sinus pain, sneezing, sore throat, tinnitus and trouble swallowing.   Eyes:  Positive for visual disturbance. Negative for pain, discharge, redness and itching.       Wears reading glasses   Respiratory:  Negative for cough, chest tightness, shortness of breath and wheezing.   Cardiovascular:  Negative for chest pain, palpitations and leg swelling.  Gastrointestinal:  Negative for abdominal distention, abdominal pain, blood in stool, constipation, diarrhea, nausea and vomiting.  Endocrine: Negative for cold intolerance, heat intolerance, polydipsia, polyphagia and polyuria.  Genitourinary:  Negative for difficulty urinating, dysuria, flank pain, frequency and urgency.       Voids sometimes once at night and at time none.   Musculoskeletal:  Positive for arthralgias. Negative for back pain, gait problem, joint swelling, myalgias, neck pain and neck stiffness.       S/p Right knee replacement 2019   Skin:  Negative for color change, pallor, rash and wound.  Neurological:  Negative for dizziness, syncope, speech difficulty, weakness, light-headedness, numbness and headaches.  Hematological:  Does not bruise/bleed easily.  Psychiatric/Behavioral:  Negative for agitation, behavioral problems, confusion, hallucinations, self-injury, sleep disturbance and suicidal ideas.        Feels anxious every once a while especially going out to a doctor's office.Grew up supper shy person.     Immunization History  Administered Date(s) Administered   PFIZER(Purple Top)SARS-COV-2 Vaccination 03/07/2020, 03/28/2020, 11/28/2020   Tdap 02/25/2013   Pertinent  Health Maintenance Due  Topic Date Due   COLONOSCOPY (Pts 45-65yrs Insurance coverage will need to be confirmed)   12/20/2019   INFLUENZA VACCINE  Never done      05/18/2017    2:23 PM 11/23/2017    1:51 PM 05/24/2018    1:24 PM 12/04/2018    1:25 PM 11/07/2022   11:15 AM  Fall Risk  Falls in the past year? No No No 0 0  Was there an injury with Fall?    0 0  Fall Risk Category Calculator    0 0  Fall Risk Category    Low Low  Patient Fall Risk Level    Low fall risk    Functional Status Survey:    Vitals:   11/07/22 1115  BP: (!) 160/90  Pulse: 94  Resp: 18  Temp: 97.8 F (36.6 C)  SpO2: 99%  Weight: 175 lb 2 oz (79.4 kg)  Height: 5' 10.28" (1.785 m)   Body mass index is 24.93 kg/m. Physical Exam  Vitals reviewed.  Constitutional:      General: He is not in acute distress.    Appearance: Normal appearance. He is normal weight. He is not ill-appearing or diaphoretic.  HENT:     Head: Normocephalic.     Right Ear: Tympanic membrane, ear canal and external ear normal. There is no impacted cerumen.     Left Ear: Tympanic membrane, ear canal and external ear normal. There is no impacted cerumen.     Nose: Nose normal. No congestion or rhinorrhea.     Mouth/Throat:     Mouth: Mucous membranes are moist.     Pharynx: Oropharynx is clear. No oropharyngeal exudate or posterior oropharyngeal erythema.  Eyes:     General: No scleral icterus.       Right eye: No discharge.        Left eye: No discharge.     Extraocular Movements: Extraocular movements intact.     Conjunctiva/sclera: Conjunctivae normal.     Pupils: Pupils are equal, round, and reactive to light.  Neck:     Vascular: No carotid bruit.  Cardiovascular:     Rate and Rhythm: Normal rate and regular rhythm.     Pulses: Normal pulses.     Heart sounds: Normal heart sounds. No murmur heard.    No friction rub. No gallop.  Pulmonary:     Effort: Pulmonary effort is normal. No respiratory distress.     Breath sounds: Normal breath sounds. No wheezing, rhonchi or rales.  Chest:     Chest wall: No tenderness.  Abdominal:      General: Bowel sounds are normal. There is no distension.     Palpations: Abdomen is soft. There is no mass.     Tenderness: There is no abdominal tenderness. There is no right CVA tenderness, left CVA tenderness, guarding or rebound.  Musculoskeletal:        General: No swelling or tenderness. Normal range of motion.     Cervical back: Normal range of motion. No rigidity or tenderness.     Right lower leg: No edema.     Left lower leg: No edema.  Lymphadenopathy:     Cervical: No cervical adenopathy.  Skin:    General: Skin is warm and dry.     Coloration: Skin is not pale.     Findings: No bruising, erythema, lesion or rash.  Neurological:     Mental Status: He is alert and oriented to person, place, and time.     Cranial Nerves: No cranial nerve deficit.     Sensory: No sensory deficit.     Motor: No weakness.     Coordination: Coordination normal.     Gait: Gait normal.  Psychiatric:        Mood and Affect: Mood normal.        Speech: Speech normal.        Behavior: Behavior normal.        Thought Content: Thought content normal.        Judgment: Judgment normal.    Labs reviewed: No results for input(s): "NA", "K", "CL", "CO2", "GLUCOSE", "BUN", "CREATININE", "CALCIUM", "MG", "PHOS" in the last 8760 hours. No results for input(s): "AST", "ALT", "ALKPHOS", "BILITOT", "PROT", "ALBUMIN" in the last 8760 hours. No results for input(s): "WBC", "NEUTROABS", "HGB", "HCT", "MCV", "PLT" in the last 8760 hours. Lab Results  Component Value Date   TSH 1.040 12/04/2018   Lab Results  Component Value Date   HGBA1C 5.9 (H) 12/04/2018  Lab Results  Component Value Date   CHOL 167 12/04/2018   HDL 55 12/04/2018   LDLCALC 100 (H) 12/04/2018   TRIG 60 12/04/2018   CHOLHDL 3.0 12/04/2018    Significant Diagnostic Results in last 30 days:  No results found.  Assessment/Plan  1. Encounter to establish care Available medical records reviewed.Declines influenza  vaccine.Recommend to get shingles and COVID-19 vaccine at the pharmacy.Recommended schedule for fasting labs.   2. Essential hypertension B/p elevated today though attributes to being nervous for today's visit.Home readings ranging in the 130's/80's at home.wean himself off HZCT and metoprolol.Reports reaction to lisinopril and Nifedipine had swollen lips and legs. Drinks Apple cider vinegar to keep B/p down.declines B/p medication. -Dietary modification and exercise advised - TSH; Future - COMPLETE METABOLIC PANEL WITH GFR; Future - CBC with Differential/Platelet; Future - hydrochlorothiazide (MICROZIDE) 12.5 MG capsule; Take 1 capsule (12.5 mg total) by mouth daily.  Dispense: 30 capsule; Refill: 3  3. Screening for prostate cancer Request PSA screening. - PSA, Total and Free; Future  4. Screening for hyperlipidemia No latest LDL for review Dietary modification and exercise advised - Lipid panel; Future  Family/ staff Communication: Reviewed plan of care with patient verbalized understanding.  Labs/tests ordered:  - PSA, Total and Free; Future - TSH; Future - COMPLETE METABOLIC PANEL WITH GFR; Future - CBC with Differential/Platelet; Future - Lipid panel; Future  Next Appointment : Return in about 6 months (around 05/08/2023) for medical mangement of chronic issues., fasting labs in one week or sooner 2 weeks f/u B/p .  Caesar Bookman, NP

## 2022-11-07 NOTE — Patient Instructions (Signed)
-   Advised to check Blood pressure at home and record on log provided and notify provider if B/p > 140/90   - Please get COVID-19 booster and shingles vaccine at the pharmacy

## 2022-11-14 ENCOUNTER — Other Ambulatory Visit: Payer: Self-pay | Admitting: Family Medicine

## 2022-11-14 DIAGNOSIS — J309 Allergic rhinitis, unspecified: Secondary | ICD-10-CM

## 2022-11-14 MED ORDER — FLUTICASONE PROPIONATE 50 MCG/ACT NA SUSP: 2.0000 | Freq: Every day | NASAL | 0 refills | Status: DC

## 2022-11-15 ENCOUNTER — Other Ambulatory Visit: Payer: Commercial Managed Care - HMO

## 2022-11-15 ENCOUNTER — Other Ambulatory Visit: Payer: Self-pay

## 2022-11-15 DIAGNOSIS — I10 Essential (primary) hypertension: Secondary | ICD-10-CM

## 2022-11-15 DIAGNOSIS — Z125 Encounter for screening for malignant neoplasm of prostate: Secondary | ICD-10-CM

## 2022-11-15 DIAGNOSIS — Z13220 Encounter for screening for lipoid disorders: Secondary | ICD-10-CM

## 2022-11-15 MED ORDER — SYSTANE BALANCE 0.6 % OP SOLN: OPHTHALMIC | 3 refills | Status: AC

## 2022-11-15 NOTE — Telephone Encounter (Signed)
Patient came   for medication refills

## 2022-11-17 LAB — COMPLETE METABOLIC PANEL WITH GFR
AG Ratio: 1.6 (calc) (ref 1.0–2.5)
ALT: 18 U/L (ref 9–46)
AST: 19 U/L (ref 10–35)
Albumin: 4.6 g/dL (ref 3.6–5.1)
Alkaline phosphatase (APISO): 58 U/L (ref 35–144)
BUN: 16 mg/dL (ref 7–25)
CO2: 27 mmol/L (ref 20–32)
Calcium: 9.6 mg/dL (ref 8.6–10.3)
Chloride: 100 mmol/L (ref 98–110)
Creat: 1.04 mg/dL (ref 0.70–1.35)
Globulin: 2.9 g/dL (calc) (ref 1.9–3.7)
Glucose, Bld: 99 mg/dL (ref 65–99)
Potassium: 4.6 mmol/L (ref 3.5–5.3)
Sodium: 137 mmol/L (ref 135–146)
Total Bilirubin: 0.9 mg/dL (ref 0.2–1.2)
Total Protein: 7.5 g/dL (ref 6.1–8.1)
eGFR: 82 mL/min/{1.73_m2} (ref >=60)

## 2022-11-17 LAB — TSH: TSH: 1.24 mIU/L (ref 0.40–4.50)

## 2022-11-17 LAB — PSA, TOTAL AND FREE
PSA, % Free: 67 % (calc) (ref >25)
PSA, Free: 0.2 ng/mL
PSA, Total: 0.3 ng/mL (ref <=4.0)

## 2022-11-17 LAB — LIPID PANEL
Cholesterol: 182 mg/dL (ref <200)
HDL: 68 mg/dL (ref >=40)
LDL Cholesterol (Calc): 100 mg/dL (calc) — ABNORMAL HIGH
Non-HDL Cholesterol (Calc): 114 mg/dL (calc) (ref <130)
Total CHOL/HDL Ratio: 2.7 (calc) (ref <5.0)
Triglycerides: 48 mg/dL (ref <150)

## 2022-11-17 LAB — CBC WITH DIFFERENTIAL/PLATELET
Absolute Monocytes: 342 cells/uL (ref 200–950)
Basophils Absolute: 20 cells/uL (ref 0–200)
Basophils Relative: 0.4 %
Eosinophils Absolute: 31 cells/uL (ref 15–500)
Eosinophils Relative: 0.6 %
HCT: 43.8 % (ref 38.5–50.0)
Hemoglobin: 15 g/dL (ref 13.2–17.1)
Lymphs Abs: 638 cells/uL — ABNORMAL LOW (ref 850–3900)
MCH: 31.1 pg (ref 27.0–33.0)
MCHC: 34.2 g/dL (ref 32.0–36.0)
MCV: 90.7 fL (ref 80.0–100.0)
MPV: 9.8 fL (ref 7.5–12.5)
Monocytes Relative: 6.7 %
Neutro Abs: 4070 cells/uL (ref 1500–7800)
Neutrophils Relative %: 79.8 %
Platelets: 315 10*3/uL (ref 140–400)
RBC: 4.83 10*6/uL (ref 4.20–5.80)
RDW: 12.3 % (ref 11.0–15.0)
Total Lymphocyte: 12.5 %
WBC: 5.1 10*3/uL (ref 3.8–10.8)

## 2023-01-03 ENCOUNTER — Encounter: Payer: Self-pay | Admitting: Family

## 2023-01-03 ENCOUNTER — Ambulatory Visit: Payer: Medicaid Other | Admitting: Family

## 2023-01-03 VITALS — BP 140/88 | HR 103 | Temp 97.6°F | Resp 16 | Ht 70.28 in | Wt 170.4 lb

## 2023-01-03 DIAGNOSIS — J309 Allergic rhinitis, unspecified: Secondary | ICD-10-CM

## 2023-01-03 DIAGNOSIS — R0982 Postnasal drip: Secondary | ICD-10-CM

## 2023-01-03 DIAGNOSIS — J0110 Acute frontal sinusitis, unspecified: Secondary | ICD-10-CM

## 2023-01-03 MED ORDER — AZITHROMYCIN 250 MG PO TABS: ORAL_TABLET | ORAL | 0 refills | Status: DC

## 2023-01-03 MED ORDER — FLUTICASONE PROPIONATE 50 MCG/ACT NA SUSP: 2.0000 | Freq: Every day | NASAL | 3 refills | Status: DC

## 2023-01-03 NOTE — Progress Notes (Signed)
Provider: Marlowe Sax FNP-C  Tekeyah Santiago, Nelda Bucks, NP  Patient Care Team: Nao Linz, Nelda Bucks, NP as PCP - General (Family Medicine)  Extended Emergency Contact Information Primary Emergency Contact: Bosie Helper. Address: [place]          [place] Phone: [phone] Mobile Phone: [phone] Relation: Father Secondary Emergency Contact: Luis Abed Address: [place], [place] [address] [place] Phone: [phone] Relation: Friend Interpreter needed? No  Code Status:  Full Code  Goals of care: Advanced Directive information    01/03/2023    1:05 PM  Advanced Directives  Does Patient Have a Medical Advance Directive? No  Would patient like information on creating a medical advance directive? No - Patient declined     Chief Complaint  Patient presents with   Acute Visit    Patient complains of possible sinus infection.     HPI:  Pt is a 62 y.o. male seen today for an acute visit for evaluation of possible sinus infection.States last week he took his Dad for doctor's appointment following day he had sinus pressure followed by clear nasal drainage which has became tan in color and thicker.He did a COVID-19 test which was negative.Symptoms also associated with drainage running down the back of the throat.Has used Flonase.  Has taken Arita Miss tea,severe cough and congestion. She denies any fever,chills,fatigue,body aches,chest tightness,chest pain,palpitation or shortness of breath.  Past Medical History:  Diagnosis Date   Allergy    Anxiety    Arthritis    OA AND PAIN RT KNEE   Heart murmur    History of colonoscopy    Hypertension    Sinus problem    Past Surgical History:  Procedure Laterality Date   FRACTURE SURGERY  1982   RT KNEE   JOINT REPLACEMENT     KELOID SURGERY     BACK OF NECK  1986 & 1988   KNEE ARTHROSCOPY Right 11/25/2014   Procedure: RIGHT  ARTHROSCOPY KNEE/SYNOVECTOMY;  Surgeon: Gearlean Alf, MD;  Location: [facility];  Service: Orthopedics;  Laterality: Right;   TOTAL KNEE ARTHROPLASTY Right 03/24/2014   Procedure: RIGHT TOTAL KNEE ARTHROPLASTY WITH HARDWARE REMOVAL;  Surgeon: Gearlean Alf, MD;  Location: [facility];  Service: Orthopedics;  Laterality: Right;    Allergies  Allergen Reactions   Lisinopril Swelling    LIPS    Nifedipine Other (See Comments)    Gingival hyperplasia.     Outpatient Encounter Medications as of 01/03/2023  Medication Sig   APPLE CIDER VINEGAR PO Take by mouth daily. 1.5 tablespoon.   aspirin 81 MG tablet Take 81 mg by mouth daily after lunch.    azithromycin (ZITHROMAX) 250 MG tablet Take 2 tablets (500 mg ) by mouth x 1 dose then one tablet ( 250 mg ) by mouth daily x 4 days   Fish Oil-Cholecalciferol (FISH OIL + D3 PO) Take 1 capsule by mouth daily after lunch.    fluticasone (FLONASE) 50 MCG/ACT nasal spray Place 2 sprays into both nostrils daily.   hydrochlorothiazide (MICROZIDE) 12.5 MG capsule Take 1 capsule (12.5 mg total) by mouth daily.   Propylene Glycol (SYSTANE BALANCE) 0.6 % SOLN 1-3 drops in each eye daily as needed for lubrication   No facility-administered encounter medications on file as of 01/03/2023.    Review of Systems  Constitutional:  Negative for appetite change, chills, fatigue, fever  and unexpected weight change.  HENT:  Positive for congestion, postnasal drip, rhinorrhea, sinus pressure and sinus pain. Negative for dental problem, ear discharge, ear pain, hearing loss, nosebleeds, sneezing, sore throat, tinnitus and trouble swallowing.   Respiratory:  Negative for cough, chest tightness, shortness of breath and wheezing.   Cardiovascular:  Negative for chest pain, palpitations and leg swelling.  Gastrointestinal:  Negative for abdominal distention, abdominal pain, blood in stool, constipation, diarrhea, nausea and vomiting.  Endocrine: Negative for cold intolerance, heat  intolerance, polydipsia, polyphagia and polyuria.  Genitourinary:  Negative for difficulty urinating, dysuria, flank pain, frequency and urgency.  Skin:  Negative for color change, pallor and rash.  Neurological:  Positive for headaches. Negative for dizziness, syncope, speech difficulty, weakness, light-headedness and numbness.       Sinus headache     Immunization History  Administered Date(s) Administered   PFIZER(Purple Top)SARS-COV-2 Vaccination 03/07/2020, 03/28/2020, 11/28/2020   Tdap 02/25/2013   Pertinent  Health Maintenance Due  Topic Date Due   COLONOSCOPY (Pts 45-23yrs Insurance coverage will need to be confirmed)  12/20/2019   INFLUENZA VACCINE  03/19/2023 (Originally 07/19/2022)      11/23/2017    1:51 PM 05/24/2018    1:24 PM 12/04/2018    1:25 PM 11/07/2022   11:15 AM 01/03/2023    1:05 PM  Fall Risk  Falls in the past year? No No 0 0 0  Was there an injury with Fall?   0 0 0  Fall Risk Category Calculator   0 0 0  Fall Risk Category (Retired)   Low Low   (RETIRED) Patient Fall Risk Level   Low fall risk    Patient at Risk for Falls Due to     No Fall Risks  Fall risk Follow up     Falls evaluation completed   Functional Status Survey:    Vitals:   01/03/23 1303  BP: (!) 140/88  Pulse: (!) 103  Resp: 16  Temp: 97.6 F (36.4 C)  SpO2: 98%  Weight: 170 lb 6.4 oz (77.3 kg)  Height: 5' 10.28" (1.785 m)   Body mass index is 24.26 kg/m. Physical Exam Vitals reviewed.  Constitutional:      General: He is not in acute distress.    Appearance: Normal appearance. He is normal weight. He is not ill-appearing or diaphoretic.  HENT:     Head: Normocephalic.     Right Ear: Tympanic membrane, ear canal and external ear normal. There is no impacted cerumen.     Left Ear: Tympanic membrane, ear canal and external ear normal. There is no impacted cerumen.     Nose: Congestion and rhinorrhea present.     Right Turbinates: Not enlarged, swollen or pale.     Left  Turbinates: Not enlarged, swollen or pale.     Right Sinus: Maxillary sinus tenderness present.     Left Sinus: Maxillary sinus tenderness present.     Mouth/Throat:     Mouth: Mucous membranes are moist.     Pharynx: Oropharynx is clear. No oropharyngeal exudate or posterior oropharyngeal erythema.  Eyes:     General: No scleral icterus.       Right eye: No discharge.        Left eye: No discharge.     Extraocular Movements: Extraocular movements intact.     Conjunctiva/sclera: Conjunctivae normal.     Pupils: Pupils are equal, round, and reactive to light.  Neck:     Vascular: No carotid bruit.  Cardiovascular:     Rate and Rhythm: Normal rate and regular rhythm.     Pulses: Normal pulses.     Heart sounds: Normal heart sounds. No murmur heard.    No friction rub. No gallop.  Pulmonary:     Effort: Pulmonary effort is normal. No respiratory distress.     Breath sounds: Normal breath sounds. No wheezing, rhonchi or rales.  Chest:     Chest wall: No tenderness.  Abdominal:     General: Bowel sounds are normal. There is no distension.     Palpations: Abdomen is soft. There is no mass.     Tenderness: There is no abdominal tenderness. There is no right CVA tenderness, left CVA tenderness, guarding or rebound.  Musculoskeletal:        General: No swelling or tenderness. Normal range of motion.     Cervical back: Normal range of motion. No rigidity or tenderness.     Right lower leg: No edema.     Left lower leg: No edema.  Lymphadenopathy:     Cervical: No cervical adenopathy.  Skin:    General: Skin is warm and dry.     Coloration: Skin is not pale.     Findings: No bruising, erythema, lesion or rash.  Neurological:     Mental Status: He is alert and oriented to person, place, and time.     Gait: Gait normal.  Psychiatric:        Mood and Affect: Mood normal.        Speech: Speech normal.        Behavior: Behavior normal.     Labs reviewed: Recent Labs     11/15/22 1338  NA 137  K 4.6  CL 100  CO2 27  GLUCOSE 99  BUN 16  CREATININE 1.04  CALCIUM 9.6   Recent Labs    11/15/22 1338  AST 19  ALT 18  BILITOT 0.9  PROT 7.5   Recent Labs    11/15/22 1338  WBC 5.1  NEUTROABS 4,070  HGB 15.0  HCT 43.8  MCV 90.7  PLT 315   Lab Results  Component Value Date   TSH 1.24 11/15/2022   Lab Results  Component Value Date   HGBA1C 5.9 (H) 12/04/2018   Lab Results  Component Value Date   CHOL 182 11/15/2022   HDL 68 11/15/2022   LDLCALC 100 (H) 11/15/2022   TRIG 48 11/15/2022   CHOLHDL 2.7 11/15/2022    Significant Diagnostic Results in last 30 days:  No results found.  1. Acute non-recurrent frontal sinusitis Afebrile  Bilateral frontal sinus tender to percussion - start on Z-pa as below  - azithromycin (ZITHROMAX) 250 MG tablet; Take 2 tablets (500 mg ) by mouth x 1 dose then one tablet ( 250 mg ) by mouth daily x 4 days  Dispense: 6 tablet; Refill: 0 -Advised to notify provider if symptoms worsen or fail to improve  2. Post-nasal drip nasal drainage on posterior pharynx reported. Start on Flonase  - fluticasone (FLONASE) 50 MCG/ACT nasal spray; Place 2 sprays into both nostrils daily.  Dispense: 16 g; Refill: 3  3. Allergic rhinitis, unspecified seasonality, unspecified trigger Continue on antihistamine  - fluticasone (FLONASE) 50 MCG/ACT nasal spray; Place 2 sprays into both nostrils daily.  Dispense: 16 g; Refill: 3  Family/ staff Communication: Reviewed plan of care with patient verbalized understanding  Labs/tests ordered: None   Next Appointment: Return if symptoms worsen or fail to improve.  Sandrea Hughs, NP

## 2023-01-03 NOTE — Patient Instructions (Addendum)
- 

## 2023-01-28 ENCOUNTER — Other Ambulatory Visit: Payer: Self-pay | Admitting: Family

## 2023-01-28 DIAGNOSIS — I10 Essential (primary) hypertension: Secondary | ICD-10-CM

## 2023-05-01 ENCOUNTER — Telehealth: Payer: Self-pay | Admitting: *Deleted

## 2023-05-01 DIAGNOSIS — I10 Essential (primary) hypertension: Secondary | ICD-10-CM

## 2023-05-01 DIAGNOSIS — E7800 Pure hypercholesterolemia, unspecified: Secondary | ICD-10-CM

## 2023-05-01 NOTE — Telephone Encounter (Signed)
Labs ordered.

## 2023-05-01 NOTE — Telephone Encounter (Signed)
Patient is scheduled for Fasting labs 05/02/2023. Please place orders in Epic for Labwork to be drawn.

## 2023-05-02 ENCOUNTER — Other Ambulatory Visit: Payer: Medicaid Other

## 2023-05-02 LAB — CBC WITH DIFFERENTIAL/PLATELET
Eosinophils Absolute: 52 cells/uL (ref 15–500)
Lymphs Abs: 873 cells/uL (ref 850–3900)
MCH: 31.4 pg (ref 27.0–33.0)
MCV: 91.5 fL (ref 80.0–100.0)
Neutro Abs: 2963 cells/uL (ref 1500–7800)
Platelets: 309 10*3/uL (ref 140–400)
RBC: 5.07 10*6/uL (ref 4.20–5.80)

## 2023-05-03 LAB — LIPID PANEL
Cholesterol: 180 mg/dL (ref <200)
HDL: 69 mg/dL (ref >=40)
LDL Cholesterol (Calc): 97 mg/dL (calc)

## 2023-05-03 LAB — COMPLETE METABOLIC PANEL WITH GFR
ALT: 19 U/L (ref 9–46)
Creat: 1.02 mg/dL (ref 0.70–1.35)
Glucose, Bld: 116 mg/dL — ABNORMAL HIGH (ref 65–99)
Potassium: 4 mmol/L (ref 3.5–5.3)
Total Bilirubin: 0.6 mg/dL (ref 0.2–1.2)

## 2023-05-03 LAB — CBC WITH DIFFERENTIAL/PLATELET
Basophils Relative: 0.5 %
Eosinophils Relative: 1.2 %
HCT: 46.4 % (ref 38.5–50.0)
MCHC: 34.3 g/dL (ref 32.0–36.0)
Neutrophils Relative %: 68.9 %
RDW: 12.2 % (ref 11.0–15.0)

## 2023-05-04 LAB — TEST AUTHORIZATION

## 2023-05-04 LAB — COMPLETE METABOLIC PANEL WITH GFR
AG Ratio: 1.5 (calc) (ref 1.0–2.5)
AST: 23 U/L (ref 10–35)
Albumin: 4.4 g/dL (ref 3.6–5.1)
Alkaline phosphatase (APISO): 50 U/L (ref 35–144)
BUN: 14 mg/dL (ref 7–25)
CO2: 27 mmol/L (ref 20–32)
Calcium: 9.6 mg/dL (ref 8.6–10.3)
Chloride: 100 mmol/L (ref 98–110)
Globulin: 3 g/dL (calc) (ref 1.9–3.7)
Sodium: 138 mmol/L (ref 135–146)
Total Protein: 7.4 g/dL (ref 6.1–8.1)
eGFR: 84 mL/min/{1.73_m2} (ref >=60)

## 2023-05-04 LAB — HEMOGLOBIN A1C W/OUT EAG: Hgb A1c MFr Bld: 5.8 % of total Hgb — ABNORMAL HIGH (ref <5.7)

## 2023-05-04 LAB — CBC WITH DIFFERENTIAL/PLATELET
Absolute Monocytes: 391 cells/uL (ref 200–950)
Basophils Absolute: 22 cells/uL (ref 0–200)
Hemoglobin: 15.9 g/dL (ref 13.2–17.1)
MPV: 9.7 fL (ref 7.5–12.5)
Monocytes Relative: 9.1 %
Total Lymphocyte: 20.3 %
WBC: 4.3 10*3/uL (ref 3.8–10.8)

## 2023-05-04 LAB — LIPID PANEL
Non-HDL Cholesterol (Calc): 111 mg/dL (calc) (ref <130)
Total CHOL/HDL Ratio: 2.6 (calc) (ref <5.0)
Triglycerides: 48 mg/dL (ref <150)

## 2023-05-04 LAB — TSH: TSH: 1.09 mIU/L (ref 0.40–4.50)

## 2023-05-10 ENCOUNTER — Ambulatory Visit: Payer: Medicaid Other | Admitting: Adult Health

## 2023-05-10 ENCOUNTER — Encounter: Payer: Self-pay | Admitting: Adult Health

## 2023-05-10 VITALS — BP 122/76 | HR 98 | Temp 97.5°F | Resp 16 | Ht 70.28 in | Wt 170.0 lb

## 2023-05-10 DIAGNOSIS — I10 Essential (primary) hypertension: Secondary | ICD-10-CM | POA: Diagnosis not present

## 2023-05-10 MED ORDER — METOPROLOL SUCCINATE ER 25 MG PO TB24: 12.5000 mg | ORAL_TABLET | Freq: Every day | ORAL | 0 refills | Status: DC

## 2023-05-10 NOTE — Progress Notes (Signed)
Location:  [facility]   Place of Service:   clinic    CODE STATUS: full   Allergies  Allergen Reactions   Lisinopril Swelling    LIPS    Nifedipine Other (See Comments)    Gingival hyperplasia.     Chief Complaint  Patient presents with   Medical Management of Chronic Issues    Patient is here for a 69M F/U for chronic conditions and lab review    HPI:  He is here for his 6 month follow up. He has been taking his hctz nightly. He has been having periods of feeling "drunk". And has also periods of blurred vision. He denies any chest pain. He is wanting to try another medication to manage his blood pressure. He is unable to take ACE/ARB due to lip swelling. 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 Will place hctz on allergy list.   Past Medical History:  Diagnosis Date   Allergy    Anxiety    Arthritis    OA AND PAIN RT KNEE   Heart murmur    History of colonoscopy    Hypertension    Sinus problem     Past Surgical History:  Procedure Laterality Date   FRACTURE SURGERY  1982   RT KNEE   JOINT REPLACEMENT     KELOID SURGERY     BACK OF NECK  1986 & 1988   KNEE ARTHROSCOPY Right 11/25/2014   Procedure: RIGHT ARTHROSCOPY KNEE/SYNOVECTOMY;  Surgeon: Loanne Drilling, MD;  Location: [facility];  Service: Orthopedics;  Laterality: Right;   TOTAL KNEE ARTHROPLASTY Right 03/24/2014   Procedure: RIGHT TOTAL KNEE ARTHROPLASTY WITH HARDWARE REMOVAL;  Surgeon: Loanne Drilling, MD;  Location: [facility];  Service: Orthopedics;  Laterality: Right;    Social History   Socioeconomic History   Marital status: Single    Spouse name: Not on file   Number of children: Not on file   Years of education: college   Highest education level: Bachelor's degree (e.g., BA, AB, BS)  Occupational History   Occupation: Journalist, newspaper  Tobacco Use   Smoking status: Never   Smokeless tobacco: Never  Substance and Sexual Activity   Alcohol use: Yes    Alcohol/week:  2.0 standard drinks of alcohol    Types: 2 Glasses of wine per week    Comment: all drinks-once a week 2 drinks   Drug use: No   Sexual activity: Yes    Birth control/protection: Condom    Comment: sex partners in the last 12 months-2  Other Topics Concern   Not on file  Social History Narrative   Not on file   Social Determinants of Health   Financial Resource Strain: Low Risk  (05/07/2023)   Overall Financial Resource Strain (CARDIA)    Difficulty of Paying Living Expenses: Not hard at all  Food Insecurity: No Food Insecurity (05/07/2023)   Hunger Vital Sign    Worried About Running Out of Food in the Last Year: Never true    Ran Out of Food in the Last Year: Never true  Transportation Needs: No Transportation Needs (05/07/2023)   PRAPARE - Administrator, Civil Service (Medical): No    Lack of Transportation (Non-Medical): No  Physical Activity: Unknown (05/07/2023)   Exercise Vital Sign    Days of Exercise per Week: 0 days    Minutes of Exercise per Session: Not on file  Stress: Stress Concern Present (05/07/2023)  [employer] of Occupational Health - Occupational Stress Questionnaire    Feeling of Stress : Very much  Social Connections: Socially Isolated (05/07/2023)   Social Connection and Isolation Panel [NHANES]    Frequency of Communication with Friends and Family: More than three times a week    Frequency of Social Gatherings with Friends and Family: Three times a week    Attends Religious Services: Never    Active Member of Clubs or Organizations: No    Attends Engineer, structural: Not on file    Marital Status: Never married  Intimate Partner Violence: Not on file   Family History  Problem Relation Age of Onset   Hypertension Mother       VITAL SIGNS BP 122/76   Pulse 98   Temp (!) 97.5 F (36.4 C)   Resp 16   Ht 5' 10.28" (1.785 m)   Wt 170 lb (77.1 kg)   SpO2 99%   BMI 24.20 kg/m   Outpatient Encounter Medications as of  05/10/2023  Medication Sig   APPLE CIDER VINEGAR PO Take by mouth daily. 1.5 tablespoon.   aspirin 81 MG tablet Take 81 mg by mouth daily after lunch.    Fish Oil-Cholecalciferol (FISH OIL + D3 PO) Take 1 capsule by mouth daily after lunch.    fluticasone (FLONASE) 50 MCG/ACT nasal spray Place 2 sprays into both nostrils daily.   hydrochlorothiazide (MICROZIDE) 12.5 MG capsule TAKE 1 CAPSULE(12.5 MG) BY MOUTH DAILY   Propylene Glycol (SYSTANE BALANCE) 0.6 % SOLN 1-3 drops in each eye daily as needed for lubrication   [DISCONTINUED] azithromycin (ZITHROMAX) 250 MG tablet Take 2 tablets (500 mg ) by mouth x 1 dose then one tablet ( 250 mg ) by mouth daily x 4 days   No facility-administered encounter medications on file as of 05/10/2023.     SIGNIFICANT DIAGNOSTIC EXAMS   Review of Systems  Constitutional:  Negative for malaise/fatigue.  Eyes:  Positive for blurred vision.  Respiratory:  Negative for cough and shortness of breath.   Cardiovascular:  Negative for chest pain, palpitations and leg swelling.  Gastrointestinal:  Negative for abdominal pain, constipation and heartburn.  Musculoskeletal:  Negative for back pain, joint pain and myalgias.  Skin: Negative.   Neurological:  Positive for dizziness.  Psychiatric/Behavioral:  The patient is not nervous/anxious.     Physical Exam Constitutional:      General: He is not in acute distress.    Appearance: He is well-developed. He is not diaphoretic.  Neck:     Thyroid: No thyromegaly.  Cardiovascular:     Rate and Rhythm: Normal rate and regular rhythm.     Pulses: Normal pulses.     Heart sounds: Normal heart sounds.  Pulmonary:     Effort: Pulmonary effort is normal. No respiratory distress.     Breath sounds: Normal breath sounds.  Abdominal:     General: Bowel sounds are normal. There is no distension.     Palpations: Abdomen is soft.     Tenderness: There is no abdominal tenderness.  Musculoskeletal:        General:  Normal range of motion.     Cervical back: Neck supple.     Right lower leg: No edema.     Left lower leg: No edema.  Lymphadenopathy:     Cervical: No cervical adenopathy.  Skin:    General: Skin is warm and dry.  Neurological:     Mental Status: He is alert  and oriented to person, place, and time.  Psychiatric:        Mood and Affect: Mood normal.       ASSESSMENT/ PLAN:  TODAY.   Primary hypertension: his blood pressure is well controlled at this time. Will stop HCTZ due to side effects. Will begin toprol xl 12.5 mg daily. I have encouraged him to take this medication at night.    Synthia Innocent NP [facility]  call [phone]

## 2023-08-04 ENCOUNTER — Other Ambulatory Visit: Payer: Self-pay | Admitting: Adult Health

## 2023-11-01 ENCOUNTER — Other Ambulatory Visit: Payer: Self-pay | Admitting: Family

## 2023-11-01 ENCOUNTER — Other Ambulatory Visit: Payer: Medicaid Other

## 2023-11-01 DIAGNOSIS — R7303 Prediabetes: Secondary | ICD-10-CM

## 2023-11-01 DIAGNOSIS — I10 Essential (primary) hypertension: Secondary | ICD-10-CM

## 2023-11-01 DIAGNOSIS — E7800 Pure hypercholesterolemia, unspecified: Secondary | ICD-10-CM

## 2023-11-02 LAB — HEMOGLOBIN A1C
Hgb A1c MFr Bld: 5.8 %{Hb} — ABNORMAL HIGH (ref <5.7)
Mean Plasma Glucose: 120 mg/dL
eAG (mmol/L): 6.6 mmol/L

## 2023-11-02 LAB — CBC WITH DIFFERENTIAL/PLATELET
Absolute Lymphocytes: 667 {cells}/uL — ABNORMAL LOW (ref 850–3900)
Absolute Monocytes: 419 {cells}/uL (ref 200–950)
Basophils Absolute: 18 {cells}/uL (ref 0–200)
Basophils Relative: 0.4 %
Eosinophils Absolute: 41 {cells}/uL (ref 15–500)
Eosinophils Relative: 0.9 %
HCT: 45 % (ref 38.5–50.0)
Hemoglobin: 15.3 g/dL (ref 13.2–17.1)
MCH: 31.2 pg (ref 27.0–33.0)
MCHC: 34 g/dL (ref 32.0–36.0)
MCV: 91.6 fL (ref 80.0–100.0)
MPV: 9.6 fL (ref 7.5–12.5)
Monocytes Relative: 9.1 %
Neutro Abs: 3455 {cells}/uL (ref 1500–7800)
Neutrophils Relative %: 75.1 %
Platelets: 289 10*3/uL (ref 140–400)
RBC: 4.91 10*6/uL (ref 4.20–5.80)
RDW: 11.9 % (ref 11.0–15.0)
Total Lymphocyte: 14.5 %
WBC: 4.6 10*3/uL (ref 3.8–10.8)

## 2023-11-02 LAB — LIPID PANEL
Cholesterol: 179 mg/dL (ref <200)
HDL: 64 mg/dL (ref >=40)
LDL Cholesterol (Calc): 104 mg/dL — ABNORMAL HIGH
Non-HDL Cholesterol (Calc): 115 mg/dL (ref <130)
Total CHOL/HDL Ratio: 2.8 (calc) (ref <5.0)
Triglycerides: 39 mg/dL (ref <150)

## 2023-11-02 LAB — COMPLETE METABOLIC PANEL WITH GFR
AG Ratio: 1.7 (calc) (ref 1.0–2.5)
ALT: 19 U/L (ref 9–46)
AST: 20 U/L (ref 10–35)
Albumin: 4.4 g/dL (ref 3.6–5.1)
Alkaline phosphatase (APISO): 50 U/L (ref 35–144)
BUN: 14 mg/dL (ref 7–25)
CO2: 28 mmol/L (ref 20–32)
Calcium: 9.3 mg/dL (ref 8.6–10.3)
Chloride: 103 mmol/L (ref 98–110)
Creat: 1.08 mg/dL (ref 0.70–1.35)
Globulin: 2.6 g/dL (ref 1.9–3.7)
Glucose, Bld: 112 mg/dL — ABNORMAL HIGH (ref 65–99)
Potassium: 4.3 mmol/L (ref 3.5–5.3)
Sodium: 140 mmol/L (ref 135–146)
Total Bilirubin: 0.6 mg/dL (ref 0.2–1.2)
Total Protein: 7 g/dL (ref 6.1–8.1)
eGFR: 78 mL/min/{1.73_m2} (ref >=60)

## 2023-11-02 LAB — TSH: TSH: 1.2 m[IU]/L (ref 0.40–4.50)

## 2023-11-07 ENCOUNTER — Ambulatory Visit: Payer: Medicaid Other | Admitting: Family

## 2023-11-07 ENCOUNTER — Encounter: Payer: Self-pay | Admitting: Family

## 2023-11-07 VITALS — BP 132/88 | HR 75 | Temp 97.6°F | Resp 20 | Ht 70.0 in | Wt 176.4 lb

## 2023-11-07 DIAGNOSIS — I10 Essential (primary) hypertension: Secondary | ICD-10-CM

## 2023-11-07 DIAGNOSIS — R7303 Prediabetes: Secondary | ICD-10-CM | POA: Diagnosis not present

## 2023-11-07 DIAGNOSIS — J309 Allergic rhinitis, unspecified: Secondary | ICD-10-CM

## 2023-11-07 DIAGNOSIS — Z125 Encounter for screening for malignant neoplasm of prostate: Secondary | ICD-10-CM

## 2023-11-07 DIAGNOSIS — E7800 Pure hypercholesterolemia, unspecified: Secondary | ICD-10-CM

## 2023-11-07 DIAGNOSIS — Z1211 Encounter for screening for malignant neoplasm of colon: Secondary | ICD-10-CM

## 2023-11-07 MED ORDER — METOPROLOL SUCCINATE ER 25 MG PO TB24: 12.5000 mg | ORAL_TABLET | Freq: Every day | ORAL | 1 refills | Status: DC

## 2023-11-07 MED ORDER — FLUTICASONE PROPIONATE 50 MCG/ACT NA SUSP: 2.0000 | Freq: Every day | NASAL | 3 refills | Status: DC

## 2023-11-07 NOTE — Patient Instructions (Addendum)
Please get Tetanus ,shingles and COVID-19 vaccine at the pharmacy

## 2023-11-07 NOTE — Progress Notes (Signed)
Provider: Richarda Blade FNP-C   Jolanda Mccann, Donalee Citrin, NP  Patient Care Team: Margie Urbanowicz, Donalee Citrin, NP as PCP - General (Family Medicine)  Extended Emergency Contact Information Primary Emergency Contact: Miki Kins. Address: [address]          [place]y [place] Home Phone: [phone] Mobile Phone: [phone] Relation: Father Secondary Emergency Contact: Kalman Drape Address: [address]          [place], [place] [address] [place] Home Phone: [phone] Relation: Friend Interpreter needed? No  Code Status:  Full Code  Goals of care: Advanced Directive information    11/07/2023    1:54 PM  Advanced Directives  Does Patient Have a Medical Advance Directive? Yes  Does patient want to make changes to medical advance directive? No - Patient declined     Chief Complaint  Patient presents with   Medical Management of Chronic Issues    6 month follow up and discuss shingles,tdap,flu,and covid vaccines and discuss colonoscopy.    HPI:  Pt is a 62 y.o. male seen today for 6 months follow up for medical management of chronic diseases.    Hypertension - States SBP 130's.takes B/p meds at night due to feeling of dizzy.symptoms resolved with change of time.   Swelling on hands - states nodes on palm have gone down but one more on the pinky declines orthopedic.   Prediabetes - A1C 5.8 previous 5.8 has been eating sweets and sodas.  Hyperlipidemia -LDL 104 previous 97 has been eating out using ranch on the salads.  Past Medical History:  Diagnosis Date   Allergy    Anxiety    Arthritis    OA AND PAIN RT KNEE   Heart murmur    History of colonoscopy    Hypertension    Sinus problem    Past Surgical History:  Procedure Laterality Date   FRACTURE SURGERY  1982   RT KNEE   JOINT REPLACEMENT     KELOID SURGERY     BACK OF NECK  1986 & 1988   KNEE ARTHROSCOPY Right 11/25/2014   Procedure: RIGHT ARTHROSCOPY  KNEE/SYNOVECTOMY;  Surgeon: Loanne Drilling, MD;  Location: [facility];  Service: Orthopedics;  Laterality: Right;   TOTAL KNEE ARTHROPLASTY Right 03/24/2014   Procedure: RIGHT TOTAL KNEE ARTHROPLASTY WITH HARDWARE REMOVAL;  Surgeon: Loanne Drilling, MD;  Location: [facility];  Service: Orthopedics;  Laterality: Right;    Allergies  Allergen Reactions   Hydrochlorothiazide     Blurred vision vertigo    Lisinopril Swelling    LIPS    Nifedipine Other (See Comments)    Gingival hyperplasia.     Allergies as of 11/07/2023       Reactions   Hydrochlorothiazide    Blurred vision vertigo    Lisinopril Swelling   LIPS    Nifedipine Other (See Comments)   Gingival hyperplasia.         Medication List        Accurate as of November 07, 2023  2:12 PM. If you have any questions, ask your nurse or doctor.          APPLE CIDER VINEGAR PO Take by mouth daily. 1.5 tablespoon.   aspirin 81 MG tablet Take 81 mg by mouth daily after lunch.   FISH OIL + D3 PO Take 1 capsule by mouth daily after lunch.   fluticasone 50 MCG/ACT nasal spray Commonly known as: FLONASE Place 2 sprays  into both nostrils daily.   metoprolol succinate 25 MG 24 hr tablet Commonly known as: TOPROL-XL TAKE 1/2 TABLET(12.5 MG) BY MOUTH DAILY   Systane Balance 0.6 % Soln Generic drug: Propylene Glycol 1-3 drops in each eye daily as needed for lubrication        Review of Systems  Constitutional:  Negative for appetite change, chills, fatigue, fever and unexpected weight change.  HENT:  Negative for congestion, dental problem, ear discharge, ear pain, facial swelling, hearing loss, nosebleeds, postnasal drip, rhinorrhea, sinus pressure, sinus pain, sneezing, sore throat, tinnitus and trouble swallowing.   Eyes:  Negative for pain, discharge, redness, itching and visual disturbance.  Respiratory:  Negative for cough, chest tightness, shortness of breath and wheezing.   Cardiovascular:  Negative for chest  pain, palpitations and leg swelling.  Gastrointestinal:  Negative for abdominal distention, abdominal pain, blood in stool, constipation, diarrhea, nausea and vomiting.  Endocrine: Negative for cold intolerance, heat intolerance, polydipsia, polyphagia and polyuria.  Genitourinary:  Negative for difficulty urinating, dysuria, flank pain, frequency and urgency.  Musculoskeletal:  Negative for arthralgias, back pain, gait problem, joint swelling, myalgias, neck pain and neck stiffness.  Skin:  Negative for color change, pallor, rash and wound.  Neurological:  Negative for dizziness, syncope, speech difficulty, weakness, light-headedness, numbness and headaches.  Hematological:  Does not bruise/bleed easily.  Psychiatric/Behavioral:  Negative for agitation, behavioral problems, confusion, hallucinations, self-injury, sleep disturbance and suicidal ideas. The patient is not nervous/anxious.     Immunization History  Administered Date(s) Administered   PFIZER(Purple Top)SARS-COV-2 Vaccination 03/07/2020, 03/28/2020, 11/28/2020   Tdap 02/25/2013   Pertinent  Health Maintenance Due  Topic Date Due   Colonoscopy  12/20/2019   INFLUENZA VACCINE  Never done      05/24/2018    1:24 PM 12/04/2018    1:25 PM 11/07/2022   11:15 AM 01/03/2023    1:05 PM 11/07/2023    1:54 PM  Fall Risk  Falls in the past year? No 0 0 0 0  Was there an injury with Fall?  0 0 0 0  Fall Risk Category Calculator  0 0 0 0  Fall Risk Category (Retired)  Low Low    (RETIRED) Patient Fall Risk Level  Low fall risk     Patient at Risk for Falls Due to    No Fall Risks No Fall Risks  Fall risk Follow up    Falls evaluation completed    Functional Status Survey:    Vitals:   11/07/23 1356  BP: 132/88  Pulse: 75  Resp: 20  Temp: 97.6 F (36.4 C)  SpO2: 96%  Weight: 176 lb 6.4 oz (80 kg)  Height: 5\' 10"  (1.778 m)   Body mass index is 25.31 kg/m. Physical Exam Vitals reviewed.  Constitutional:      General:  He is not in acute distress.    Appearance: Normal appearance. He is overweight. He is not ill-appearing or diaphoretic.  HENT:     Head: Normocephalic.     Right Ear: Tympanic membrane, ear canal and external ear normal. There is no impacted cerumen.     Left Ear: Tympanic membrane, ear canal and external ear normal. There is no impacted cerumen.     Nose: Nose normal. No congestion or rhinorrhea.     Mouth/Throat:     Mouth: Mucous membranes are moist.     Pharynx: Oropharynx is clear. No oropharyngeal exudate or posterior oropharyngeal erythema.  Eyes:     General: No  scleral icterus.       Right eye: No discharge.        Left eye: No discharge.     Extraocular Movements: Extraocular movements intact.     Conjunctiva/sclera: Conjunctivae normal.     Pupils: Pupils are equal, round, and reactive to light.  Neck:     Vascular: No carotid bruit.  Cardiovascular:     Rate and Rhythm: Normal rate and regular rhythm.     Pulses: Normal pulses.     Heart sounds: Normal heart sounds. No murmur heard.    No friction rub. No gallop.  Pulmonary:     Effort: Pulmonary effort is normal. No respiratory distress.     Breath sounds: Normal breath sounds. No wheezing, rhonchi or rales.  Chest:     Chest wall: No tenderness.  Abdominal:     General: Bowel sounds are normal. There is no distension.     Palpations: Abdomen is soft. There is no mass.     Tenderness: There is no abdominal tenderness. There is no right CVA tenderness, left CVA tenderness, guarding or rebound.  Musculoskeletal:        General: No swelling or tenderness. Normal range of motion.     Cervical back: Normal range of motion. No rigidity or tenderness.     Right lower leg: No edema.     Left lower leg: No edema.  Lymphadenopathy:     Cervical: No cervical adenopathy.  Skin:    General: Skin is warm and dry.     Coloration: Skin is not pale.     Findings: No bruising, erythema, lesion or rash.  Neurological:      Mental Status: He is alert and oriented to person, place, and time.     Cranial Nerves: No cranial nerve deficit.     Sensory: No sensory deficit.     Motor: No weakness.     Coordination: Coordination normal.     Gait: Gait normal.  Psychiatric:        Mood and Affect: Mood normal.        Speech: Speech normal.        Behavior: Behavior normal.        Thought Content: Thought content normal.        Judgment: Judgment normal.     Labs reviewed: Recent Labs    11/15/22 1338 05/02/23 1149 11/01/23 1429  NA 137 138 140  K 4.6 4.0 4.3  CL 100 100 103  CO2 27 27 28   GLUCOSE 99 116* 112*  BUN 16 14 14   CREATININE 1.04 1.02 1.08  CALCIUM 9.6 9.6 9.3   Recent Labs    11/15/22 1338 05/02/23 1149 11/01/23 1429  AST 19 23 20   ALT 18 19 19   BILITOT 0.9 0.6 0.6  PROT 7.5 7.4 7.0   Recent Labs    11/15/22 1338 05/02/23 1149 11/01/23 1429  WBC 5.1 4.3 4.6  NEUTROABS 4,070 2,963 3,455  HGB 15.0 15.9 15.3  HCT 43.8 46.4 45.0  MCV 90.7 91.5 91.6  PLT 315 309 289   Lab Results  Component Value Date   TSH 1.20 11/01/2023   Lab Results  Component Value Date   HGBA1C 5.8 (H) 11/01/2023   Lab Results  Component Value Date   CHOL 179 11/01/2023   HDL 64 11/01/2023   LDLCALC 104 (H) 11/01/2023   TRIG 39 11/01/2023   CHOLHDL 2.8 11/01/2023    Significant Diagnostic Results in last 30 days:  No results found.  Assessment/Plan  1. Allergic rhinitis, unspecified seasonality, unspecified trigger Continue on Flonase  - fluticasone (FLONASE) 50 MCG/ACT nasal spray; Place 2 sprays into both nostrils daily.  Dispense: 16 g; Refill: 3  2. Essential hypertension B/p well controlled  -Continue on metoprolol - continue dietary modification and exercise at least three times per week for 30 minutes.  - metoprolol succinate (TOPROL-XL) 25 MG 24 hr tablet; Take 0.5 tablets (12.5 mg total) by mouth daily. TAKE 1/2 TABLET(12.5 MG) BY MOUTH DAILY  Dispense: 45 tablet; Refill:  1 - TSH; Future - COMPLETE METABOLIC PANEL WITH GFR; Future - CBC with Differential/Platelet; Future  3. Pure hypercholesterolemia LDL not at goal -Dietary modification and exercise advised -Continue on fish oil - Lipid panel; Future  4. Prediabetes Lab Results  Component Value Date   HGBA1C 5.8 (H) 11/01/2023  -Dietary modification and exercise at least 30 minutes 3 times per week - Hemoglobin A1c; Future  5. Screening for prostate cancer Asymptomatic - PSA, Total and Free  6. Colon cancer screening Cologuard versus colonoscopy discussed patient prefers colonoscopy.  Will refer to gastroenterology.Made aware specialist office will call him to schedule appointment. - Ambulatory referral to Gastroenterology  Family/ staff Communication: Reviewed plan of care with patient verbalized understanding  Labs/tests ordered:  - Lipid panel; Future - Hemoglobin A1c; Future - PSA, Total and Free - TSH; Future - COMPLETE METABOLIC PANEL WITH GFR; Future - CBC with Differential/Platelet; Future  Next Appointment : Return in about 6 months (around 05/06/2024) for medical mangement of chronic issues., Fasting labs in 6 months prior to visit.   Damon Bookman, NP

## 2023-11-08 ENCOUNTER — Ambulatory Visit: Payer: Medicaid Other | Admitting: Family

## 2023-11-09 ENCOUNTER — Telehealth: Payer: Self-pay

## 2023-11-09 LAB — PSA, TOTAL AND FREE
PSA, % Free: 67 % (ref >25)
PSA, Free: 0.2 ng/mL
PSA, Total: 0.3 ng/mL (ref <=4.0)

## 2023-11-09 NOTE — Telephone Encounter (Signed)
Patient is calling for his lab results. I informed patient that he would be called when resulted

## 2024-05-01 ENCOUNTER — Other Ambulatory Visit: Payer: Medicaid Other

## 2024-05-01 DIAGNOSIS — I10 Essential (primary) hypertension: Secondary | ICD-10-CM

## 2024-05-01 DIAGNOSIS — E7800 Pure hypercholesterolemia, unspecified: Secondary | ICD-10-CM

## 2024-05-01 DIAGNOSIS — R7303 Prediabetes: Secondary | ICD-10-CM

## 2024-05-02 LAB — TSH: TSH: 1.13 m[IU]/L (ref 0.40–4.50)

## 2024-05-02 LAB — CBC WITH DIFFERENTIAL/PLATELET
Absolute Lymphocytes: 819 {cells}/uL — ABNORMAL LOW (ref 850–3900)
Absolute Monocytes: 410 {cells}/uL (ref 200–950)
Basophils Absolute: 19 {cells}/uL (ref 0–200)
Basophils Relative: 0.3 %
Eosinophils Absolute: 69 {cells}/uL (ref 15–500)
Eosinophils Relative: 1.1 %
HCT: 43.4 % (ref 38.5–50.0)
Hemoglobin: 14.4 g/dL (ref 13.2–17.1)
MCH: 31.3 pg (ref 27.0–33.0)
MCHC: 33.2 g/dL (ref 32.0–36.0)
MCV: 94.3 fL (ref 80.0–100.0)
MPV: 9.7 fL (ref 7.5–12.5)
Monocytes Relative: 6.5 %
Neutro Abs: 4983 {cells}/uL (ref 1500–7800)
Neutrophils Relative %: 79.1 %
Platelets: 291 10*3/uL (ref 140–400)
RBC: 4.6 10*6/uL (ref 4.20–5.80)
RDW: 12.5 % (ref 11.0–15.0)
Total Lymphocyte: 13 %
WBC: 6.3 10*3/uL (ref 3.8–10.8)

## 2024-05-02 LAB — LIPID PANEL
Cholesterol: 166 mg/dL (ref <200)
HDL: 64 mg/dL (ref >=40)
LDL Cholesterol (Calc): 88 mg/dL
Non-HDL Cholesterol (Calc): 102 mg/dL (ref <130)
Total CHOL/HDL Ratio: 2.6 (calc) (ref <5.0)
Triglycerides: 49 mg/dL (ref <150)

## 2024-05-02 LAB — COMPLETE METABOLIC PANEL WITHOUT GFR
AG Ratio: 1.6 (calc) (ref 1.0–2.5)
ALT: 15 U/L (ref 9–46)
AST: 18 U/L (ref 10–35)
Albumin: 4.4 g/dL (ref 3.6–5.1)
Alkaline phosphatase (APISO): 57 U/L (ref 35–144)
BUN: 16 mg/dL (ref 7–25)
CO2: 25 mmol/L (ref 20–32)
Calcium: 9.4 mg/dL (ref 8.6–10.3)
Chloride: 102 mmol/L (ref 98–110)
Creat: 0.91 mg/dL (ref 0.70–1.35)
Globulin: 2.8 g/dL (ref 1.9–3.7)
Glucose, Bld: 98 mg/dL (ref 65–99)
Potassium: 4.3 mmol/L (ref 3.5–5.3)
Sodium: 136 mmol/L (ref 135–146)
Total Bilirubin: 0.4 mg/dL (ref 0.2–1.2)
Total Protein: 7.2 g/dL (ref 6.1–8.1)

## 2024-05-02 LAB — HEMOGLOBIN A1C
Hgb A1c MFr Bld: 5.6 % (ref <5.7)
Mean Plasma Glucose: 114 mg/dL
eAG (mmol/L): 6.3 mmol/L

## 2024-05-08 ENCOUNTER — Ambulatory Visit: Payer: Medicaid Other | Admitting: Family

## 2024-05-08 ENCOUNTER — Encounter: Payer: Self-pay | Admitting: Family

## 2024-05-08 VITALS — BP 136/84 | HR 79 | Temp 97.7°F | Resp 19 | Ht 70.0 in | Wt 169.4 lb

## 2024-05-08 DIAGNOSIS — I10 Essential (primary) hypertension: Secondary | ICD-10-CM

## 2024-05-08 DIAGNOSIS — Z1211 Encounter for screening for malignant neoplasm of colon: Secondary | ICD-10-CM

## 2024-05-08 DIAGNOSIS — R7303 Prediabetes: Secondary | ICD-10-CM

## 2024-05-08 DIAGNOSIS — E7800 Pure hypercholesterolemia, unspecified: Secondary | ICD-10-CM

## 2024-05-08 DIAGNOSIS — R0982 Postnasal drip: Secondary | ICD-10-CM

## 2024-05-08 MED ORDER — MAGNESIUM GLYCINATE 100 MG PO CAPS: 1.0000 | ORAL_CAPSULE | Freq: Every day | ORAL | Status: DC

## 2024-05-08 NOTE — Progress Notes (Signed)
 Provider: Christean Courts FNP-C   Chelise Hanger, Elijio Guadeloupe, NP  Patient Care Team: Aryan Sparks, Elijio Guadeloupe, NP as PCP - General (Family Medicine)  Extended Emergency Contact Information Primary Emergency Contact: Dagoberto Drop. Address: [address]          [place], [place] Home Phone: [phone] Mobile Phone: [phone] Relation: Father Secondary Emergency Contact: [facility] Address: [address]          [place], [place] [address] [place] Home Phone: [phone] Relation: Friend Interpreter needed? No  Code Status:  Full Code  Goals of care: Advanced Directive information    05/08/2024    1:43 PM  Advanced Directives  Does Patient Have a Medical Advance Directive? No  Would patient like information on creating a medical advance directive? No - Patient declined     Chief Complaint  Patient presents with   Follow-up    Routine 6 month follow up chronic issues, patient has hypertension    Discussed the use of AI scribe software for clinical note transcription with the patient, who gave verbal consent to proceed.  History of Present Illness   Damon Butler is a 63 year old male who presents for a six-month follow-up visit.  Over the past six months, he has experienced significant improvement in his health metrics, with weight loss from 176 pounds to 169 pounds. This weight loss is attributed to dietary changes and increased physical activity, such as walking and working on cars. He has reduced his intake of starches and is focused on maintaining his health numbers.  Previously diagnosed as prediabetic with an A1c of 5.8, his recent lab work shows an A1c of 5.6. His glucose level has decreased from 116 to 98. He manages his condition through diet and exercise.  He continues to take aspirin and fish oil but has run out of fish oil and started taking turmeric capsules about a month ago for joint pain. He is  considering magnesium for muscle cramps but has not started it yet. He also tried apple cider vinegar gummies, which he associates with his father's health practices.  He takes metoprolol  12.5 mg for blood pressure, sometimes skipping doses due to dizziness. He has adjusted to taking it at night to manage these symptoms. He also uses Flonase  occasionally for allergies, particularly during high pollen seasons.  No issues with constipation, diarrhea, or urination, although he sometimes feels dehydrated and is making an effort to drink more water. He also drinks green tea with honey, especially during allergy seasons, to help with sinus issues.    Past Medical History:  Diagnosis Date   Allergy    Anxiety    Arthritis    OA AND PAIN RT KNEE   Heart murmur    History of colonoscopy    Hypertension    Sinus problem    Past Surgical History:  Procedure Laterality Date   FRACTURE SURGERY  1982   RT KNEE   JOINT REPLACEMENT     KELOID SURGERY     BACK OF NECK  1986 & 1988   KNEE ARTHROSCOPY Right 11/25/2014   Procedure: RIGHT ARTHROSCOPY KNEE/SYNOVECTOMY;  Surgeon: Aurther Blue, MD;  Location: [facility];  Service: Orthopedics;  Laterality: Right;   TOTAL KNEE ARTHROPLASTY Right 03/24/2014   Procedure: RIGHT TOTAL KNEE ARTHROPLASTY WITH HARDWARE REMOVAL;  Surgeon: Aurther Blue, MD;  Location: [facility];  Service: Orthopedics;  Laterality: Right;    Allergies  Allergen  Reactions   Hydrochlorothiazide      Blurred vision vertigo    Lisinopril  Swelling    LIPS    Nifedipine  Other (See Comments)    Gingival hyperplasia.     Allergies as of 05/08/2024       Reactions   Hydrochlorothiazide     Blurred vision vertigo    Lisinopril  Swelling   LIPS    Nifedipine  Other (See Comments)   Gingival hyperplasia.         Medication List        Accurate as of May 08, 2024  2:22 PM. If you have any questions, ask your nurse or doctor.          APPLE CIDER VINEGAR PO Take by mouth  daily. 1.5 tablespoon.   aspirin 81 MG tablet Take 81 mg by mouth daily after lunch.   FISH OIL + D3 PO Take 1 capsule by mouth daily after lunch.   fluticasone  50 MCG/ACT nasal spray Commonly known as: FLONASE  Place 2 sprays into both nostrils daily.   Magnesium Glycinate 100 MG Caps Take 1 capsule by mouth daily. Started by: Bentlie Withem C Chance Munter   metoprolol  succinate 25 MG 24 hr tablet Commonly known as: TOPROL -XL Take 0.5 tablets (12.5 mg total) by mouth daily. TAKE 1/2 TABLET(12.5 MG) BY MOUTH DAILY   QC TUMERIC COMPLEX PO Take 700 mg by mouth daily.   Systane Balance 0.6 % Soln Generic drug: Propylene Glycol 1-3 drops in each eye daily as needed for lubrication        Review of Systems  Constitutional:  Negative for appetite change, chills, fatigue, fever and unexpected weight change.  HENT:  Negative for congestion, dental problem, ear discharge, ear pain, facial swelling, hearing loss, nosebleeds, postnasal drip, rhinorrhea, sinus pressure, sinus pain, sneezing, sore throat, tinnitus and trouble swallowing.   Eyes:  Negative for pain, discharge, redness, itching and visual disturbance.  Respiratory:  Negative for cough, chest tightness, shortness of breath and wheezing.   Cardiovascular:  Negative for chest pain, palpitations and leg swelling.  Gastrointestinal:  Negative for abdominal distention, abdominal pain, blood in stool, constipation, diarrhea, nausea and vomiting.  Endocrine: Negative for cold intolerance, heat intolerance, polydipsia, polyphagia and polyuria.  Genitourinary:  Negative for difficulty urinating, dysuria, flank pain, frequency and urgency.  Musculoskeletal:  Negative for arthralgias, back pain, gait problem, joint swelling, myalgias, neck pain and neck stiffness.  Skin:  Negative for color change, pallor, rash and wound.  Neurological:  Negative for dizziness, syncope, speech difficulty, weakness, light-headedness, numbness and headaches.   Hematological:  Does not bruise/bleed easily.  Psychiatric/Behavioral:  Negative for agitation, behavioral problems, confusion, hallucinations, self-injury, sleep disturbance and suicidal ideas. The patient is not nervous/anxious.     Immunization History  Administered Date(s) Administered   PFIZER(Purple Top)SARS-COV-2 Vaccination 03/07/2020, 03/28/2020, 11/28/2020   Tdap 02/25/2013   Pertinent  Health Maintenance Due  Topic Date Due   Colonoscopy  12/20/2019   INFLUENZA VACCINE  07/19/2024      12/04/2018    1:25 PM 11/07/2022   11:15 AM 01/03/2023    1:05 PM 11/07/2023    1:54 PM 05/08/2024    1:42 PM  Fall Risk  Falls in the past year? 0 0 0 0 0  Was there an injury with Fall? 0 0 0 0 0  Fall Risk Category Calculator 0 0 0 0 0  Fall Risk Category (Retired) Low Low     (RETIRED) Patient Fall Risk Level Low fall risk  Patient at Risk for Falls Due to   No Fall Risks No Fall Risks No Fall Risks  Fall risk Follow up   Falls evaluation completed  Falls evaluation completed   Functional Status Survey:    Vitals:   05/07/24 1634  BP: 136/84  Pulse: 79  Resp: 19  Temp: 97.7 F (36.5 C)  SpO2: 97%  Weight: 169 lb 6.4 oz (76.8 kg)  Height: 5\' 10"  (1.778 m)   Body mass index is 24.31 kg/m. Physical Exam  VITALS: T- 97.7, P- 79, BP- 136/84, SaO2- 97% MEASUREMENTS: Weight- 169. GENERAL: Alert, cooperative, well developed, no acute distress. HEENT: Normocephalic, normal oropharynx, moist mucous membranes, ears normal without signs of infection, nose without swelling, no sinus tenderness. CHEST: Clear to auscultation bilaterally, no wheezes, rhonchi, or crackles. CARDIOVASCULAR: Normal heart rate and rhythm, S1 and S2 normal without murmurs. ABDOMEN: Soft, non-tender, non-distended, without organomegaly, normal bowel sounds. EXTREMITIES: No cyanosis, edema, or numbness in legs. MUSCULOSKELETAL: Right knee with grinding, status post replacement; left knee normal, no  pain. NEUROLOGICAL: Cranial nerves grossly intact, moves all extremities without gross motor or sensory deficit.  SKIN: No rash,no lesion or erythema   PSYCHIATRY/BEHAVIORAL: Mood stable    Labs reviewed: Recent Labs    11/01/23 1429 05/01/24 1333  NA 140 136  K 4.3 4.3  CL 103 102  CO2 28 25  GLUCOSE 112* 98  BUN 14 16  CREATININE 1.08 0.91  CALCIUM 9.3 9.4   Recent Labs    11/01/23 1429 05/01/24 1333  AST 20 18  ALT 19 15  BILITOT 0.6 0.4  PROT 7.0 7.2   Recent Labs    11/01/23 1429 05/01/24 1333  WBC 4.6 6.3  NEUTROABS 3,455 4,983  HGB 15.3 14.4  HCT 45.0 43.4  MCV 91.6 94.3  PLT 289 291   Lab Results  Component Value Date   TSH 1.13 05/01/2024   Lab Results  Component Value Date   HGBA1C 5.6 05/01/2024   Lab Results  Component Value Date   CHOL 166 05/01/2024   HDL 64 05/01/2024   LDLCALC 88 05/01/2024   TRIG 49 05/01/2024   CHOLHDL 2.6 05/01/2024    Significant Diagnostic Results in last 30 days:  No results found.  Assessment/Plan  Essential hypertension Blood pressure is well-controlled at 136/84 mmHg. Reports occasional dizziness, possibly related to metoprolol  use. Has been taking metoprolol  12.5 mg intermittently due to dizziness. Discussed the importance of monitoring blood pressure, especially when experiencing dizziness, and adjusting medication as needed. - Continue metoprolol  12.5 mg as needed - Monitor blood pressure regularly, especially when experiencing dizziness - Consider adjusting metoprolol  dosage if dizziness persists  Prediabetes Previously diagnosed with prediabetes with an A1c of 5.8. Recent lifestyle modifications, including dietary changes and increased physical activity, have reduced A1c to 5.6, indicating normal glucose levels. Glucose levels have decreased from 116 to 98. Emphasized the importance of maintaining these lifestyle changes to prevent recurrence of prediabetes. - Continue current dietary and exercise  regimen to maintain normal glucose levels - Recheck A1c and glucose levels in six months  Right knee replacement Reports occasional grinding sensation in the right knee, which has undergone replacement. No significant pain reported. Taking turmeric for joint inflammation and considering magnesium for muscle cramps. Discussed the benefits of turmeric for inflammation and magnesium for muscle cramps. - Continue turmeric for joint inflammation - Consider starting magnesium glycinate for muscle cramps  General Health Maintenance Due for shingles vaccine, Tdap, and colonoscopy. Discussed the  option of Cologuard as an alternative to colonoscopy, noting that a positive result would necessitate a follow-up colonoscopy. Prefers colonoscopy despite previous adverse reactions to anesthesia due to the ability to remove polyps during the procedure. - Administer shingles vaccine and Tdap - Schedule colonoscopy   Family/ staff Communication: Reviewed plan of care with patient verbalized understanding   Labs/tests ordered:  - CBC with Differential/Platelet - CMP with eGFR([facility]) - TSH - Hgb A1C - Lipid panel   Next Appointment : Return in about 6 months (around 11/08/2024) for medical mangement of chronic issues., Fasting labs in 6 months prior to visit.   Spent 30 minutes of Face to face and non-face to face with patient  >50% time spent counseling; reviewing medical record; tests; labs; documentation and developing future plan of care.   Estil Heman, NP

## 2024-08-23 ENCOUNTER — Encounter (HOSPITAL_BASED_OUTPATIENT_CLINIC_OR_DEPARTMENT_OTHER): Payer: Self-pay

## 2024-08-23 ENCOUNTER — Emergency Department (HOSPITAL_BASED_OUTPATIENT_CLINIC_OR_DEPARTMENT_OTHER): Admission: EM | Admit: 2024-08-23 | Discharge: 2024-08-23 | Disposition: A | Discharge status: Home / Self Care

## 2024-08-23 ENCOUNTER — Other Ambulatory Visit: Payer: Self-pay

## 2024-08-23 DIAGNOSIS — R220 Localized swelling, mass and lump, head: Secondary | ICD-10-CM | POA: Diagnosis present

## 2024-08-23 DIAGNOSIS — B029 Zoster without complications: Secondary | ICD-10-CM | POA: Diagnosis not present

## 2024-08-23 DIAGNOSIS — Z7982 Long term (current) use of aspirin: Secondary | ICD-10-CM | POA: Insufficient documentation

## 2024-08-23 LAB — CBC WITH DIFFERENTIAL/PLATELET
Abs Immature Granulocytes: 0.03 K/uL (ref 0.00–0.07)
Basophils Absolute: 0 K/uL (ref 0.0–0.1)
Basophils Relative: 0 %
Eosinophils Absolute: 0 K/uL (ref 0.0–0.5)
Eosinophils Relative: 0 %
HCT: 41.4 % (ref 39.0–52.0)
Hemoglobin: 14.2 g/dL (ref 13.0–17.0)
Immature Granulocytes: 0 %
Lymphocytes Relative: 7 %
Lymphs Abs: 0.6 K/uL — ABNORMAL LOW (ref 0.7–4.0)
MCH: 30.9 pg (ref 26.0–34.0)
MCHC: 34.3 g/dL (ref 30.0–36.0)
MCV: 90.2 fL (ref 80.0–100.0)
Monocytes Absolute: 0.7 K/uL (ref 0.1–1.0)
Monocytes Relative: 9 %
Neutro Abs: 7 K/uL (ref 1.7–7.7)
Neutrophils Relative %: 84 %
Platelets: 283 K/uL (ref 150–400)
RBC: 4.59 MIL/uL (ref 4.22–5.81)
RDW: 12 % (ref 11.5–15.5)
WBC: 8.4 K/uL (ref 4.0–10.5)
nRBC: 0 % (ref 0.0–0.2)

## 2024-08-23 LAB — COMPREHENSIVE METABOLIC PANEL WITH GFR
ALT: 14 U/L (ref 0–44)
AST: 35 U/L (ref 15–41)
Albumin: 4.3 g/dL (ref 3.5–5.0)
Alkaline Phosphatase: 55 U/L (ref 38–126)
Anion gap: 14 (ref 5–15)
BUN: 10 mg/dL (ref 8–23)
CO2: 22 mmol/L (ref 22–32)
Calcium: 9.6 mg/dL (ref 8.9–10.3)
Chloride: 100 mmol/L (ref 98–111)
Creatinine, Ser: 1.14 mg/dL (ref 0.61–1.24)
GFR, Estimated: >60 mL/min (ref >60)
Glucose, Bld: 99 mg/dL (ref 70–99)
Potassium: 5 mmol/L (ref 3.5–5.1)
Sodium: 136 mmol/L (ref 135–145)
Total Bilirubin: 0.7 mg/dL (ref 0.0–1.2)
Total Protein: 7.7 g/dL (ref 6.5–8.1)

## 2024-08-23 MED ORDER — VALACYCLOVIR HCL 1 G PO TABS: 1000.0000 mg | ORAL_TABLET | Freq: Three times a day (TID) | ORAL | 0 refills | Status: AC

## 2024-08-23 NOTE — ED Triage Notes (Signed)
 Pt POV reporting L side facial pain past few days, L side of bottom lip now swollen, concerned for infection. Airway intact.

## 2024-08-23 NOTE — Discharge Instructions (Signed)
 You have shingles.  I did call in antivirals for you.  Please take that as prescribed.   If you have any, worsening fever, swelling of the left side of your face or other concerning findings  then please come back to the ED for further evaluation.   Keep the area covered.  If you have any eye pain or confusion come back to the ED

## 2024-08-23 NOTE — ED Provider Notes (Signed)
 [place] EMERGENCY DEPARTMENT AT [facility] Provider Note   CSN: [id number] Arrival date & time: 08/23/24  1807     Patient presents with: Oral Swelling   Damon Butler is a 63 y.o. male.   HPI     Presents because of left-sided facial pain.  Patient states that start about a week ago.  Endorsing pain along the left side of the ear.  Started with swelling on the left side of his face as well.  Been having some blistering.  No dental pain.  No eye pain.  No fever no chills.  Just endorsing pain along his left side of his face.  Prior to Admission medications   Medication Sig Start Date End Date Taking? Authorizing Provider  valACYclovir  (VALTREX ) 1000 MG tablet Take 1 tablet (1,000 mg total) by mouth 3 (three) times daily for 7 days. 08/23/24 08/30/24 Yes Simon Lavonia SAILOR, MD  APPLE CIDER VINEGAR PO Take by mouth daily. 1.5 tablespoon.    [provider]  aspirin 81 MG tablet Take 81 mg by mouth daily after lunch.    [provider]  Fish Oil-Cholecalciferol (FISH OIL + D3 PO) Take 1 capsule by mouth daily after lunch.     [provider]  fluticasone  (FLONASE ) 50 MCG/ACT nasal spray Place 2 sprays into both nostrils daily. 11/07/23   Ngetich, Dinah C, NP  Magnesium  Glycinate 100 MG CAPS Take 1 capsule by mouth daily. 05/08/24   Ngetich, Dinah C, NP  metoprolol  succinate (TOPROL -XL) 25 MG 24 hr tablet Take 0.5 tablets (12.5 mg total) by mouth daily. TAKE 1/2 TABLET(12.5 MG) BY MOUTH DAILY 11/07/23   Ngetich, Dinah C, NP  Propylene Glycol (SYSTANE BALANCE) 0.6 % SOLN 1-3 drops in each eye daily as needed for lubrication 11/15/22   Ngetich, Dinah C, NP  Turmeric (QC TUMERIC COMPLEX PO) Take 700 mg by mouth daily.    [provider]    Allergies: Hydrochlorothiazide , Lisinopril , and Nifedipine     Review of Systems  Constitutional:  Negative for chills and fever.  HENT:  Negative for ear pain and sore throat.   Eyes:  Negative for  pain and visual disturbance.  Respiratory:  Negative for cough and shortness of breath.   Cardiovascular:  Negative for chest pain and palpitations.  Gastrointestinal:  Negative for abdominal pain and vomiting.  Genitourinary:  Negative for dysuria and hematuria.  Musculoskeletal:  Negative for arthralgias and back pain.  Skin:  Negative for color change and rash.  Neurological:  Negative for seizures and syncope.  All other systems reviewed and are negative.   Updated Vital Signs BP 138/83 (BP Location: Right Arm)   Pulse 78   Temp 99.1 F (37.3 C) (Oral)   Resp 18   Ht 5' 11 (1.803 m)   Wt 76.7 kg   SpO2 99%   BMI 23.57 kg/m   Physical Exam Vitals and nursing note reviewed.  Constitutional:      General: He is not in acute distress.    Appearance: He is well-developed.  HENT:     Head: Normocephalic and atraumatic.   Eyes:     Conjunctiva/sclera: Conjunctivae normal.  Cardiovascular:     Rate and Rhythm: Normal rate and regular rhythm.     Heart sounds: No murmur heard. Pulmonary:     Effort: Pulmonary effort is normal. No respiratory distress.     Breath sounds: Normal breath sounds.  Abdominal:     Palpations: Abdomen is soft.  Tenderness: There is no abdominal tenderness.  Musculoskeletal:        General: No swelling.     Cervical back: Neck supple.  Skin:    General: Skin is warm and dry.     Capillary Refill: Capillary refill takes less than 2 seconds.  Neurological:     Mental Status: He is alert.  Psychiatric:        Mood and Affect: Mood normal.     (all labs ordered are listed, but only abnormal results are displayed) Labs Reviewed  CBC WITH DIFFERENTIAL/PLATELET - Abnormal; Notable for the following components:      Result Value   Lymphs Abs 0.6 (*)    All other components within normal limits  COMPREHENSIVE METABOLIC PANEL WITH GFR    EKG: None  Radiology: No results found.   Procedures   Medications Ordered in the ED - No data  to display                                  Medical Decision Making Amount and/or Complexity of Data Reviewed Labs: ordered.   Presents because of left-sided facial pain.  Patient states that start about a week ago.  Endorsing pain along the left side of the ear.  Started with swelling on the left side of his face as well.  Been having some blistering.  No dental pain.  No eye pain.  No fever no chills.  Just endorsing pain along his left side of his face   Patient exam consistent for herpes zoster.  No evidence of superimposed infection.  No evidence of impetigo.  Follows a V3 distribution.  Does not cross midline.  Patient was endorsing ear pain but cannot think an obvious vesicles on his ear drawn.  Certainly no ocular involvement.  Negative Hutchinson sign.  Not disseminated.  No history of immunocompromise.   Patient started on valacyclovir .  Creatinine normal.  Discharge stable condition.      Final diagnoses:  Herpes zoster without complication    ED Discharge Orders          Ordered    valACYclovir  (VALTREX ) 1000 MG tablet  3 times daily        08/23/24 2323               Simon Lavonia SAILOR, MD 08/23/24 2326

## 2024-09-05 ENCOUNTER — Encounter: Payer: Self-pay | Admitting: Family

## 2024-09-05 ENCOUNTER — Ambulatory Visit: Admitting: Family

## 2024-09-05 VITALS — BP 128/82 | HR 84 | Temp 97.8°F | Resp 19 | Ht 71.0 in | Wt 164.6 lb

## 2024-09-05 DIAGNOSIS — B028 Zoster with other complications: Secondary | ICD-10-CM

## 2024-09-05 DIAGNOSIS — I10 Essential (primary) hypertension: Secondary | ICD-10-CM | POA: Diagnosis not present

## 2024-09-05 MED ORDER — VALACYCLOVIR HCL 1 G PO TABS: 1000.0000 mg | ORAL_TABLET | Freq: Three times a day (TID) | ORAL | 0 refills | Status: AC

## 2024-09-05 MED ORDER — METOPROLOL SUCCINATE ER 25 MG PO TB24: 12.5000 mg | ORAL_TABLET | Freq: Every day | ORAL | 3 refills | Status: AC

## 2024-09-05 NOTE — Progress Notes (Signed)
 Provider: Roxan Plough FNP-C  Asianna Brundage, Roxan BROCKS, NP  Patient Care Team: Kraig Genis, Roxan BROCKS, NP as PCP - General (Family Medicine)  Extended Emergency Contact Information Primary Emergency Contact: Billy Morna FALCON. Address: [address]          [place], [place] Home Phone: [phone] Mobile Phone: [phone] Relation: Father Secondary Emergency Contact: [facility] Address: [address]          [place], [place] [address] [place] Home Phone: [phone] Relation: Friend Interpreter needed? No  Code Status: Full Code  Goals of care: Advanced Directive information    09/05/2024    2:19 PM  Advanced Directives  Does Patient Have a Medical Advance Directive? No  Would patient like information on creating a medical advance directive? No - Patient declined     Chief Complaint  Patient presents with   Transitions Of [facility] follow up.    Discussed the use of AI scribe software for clinical note transcription with the patient, who gave verbal consent to proceed.  History of Present Illness   Damon Butler is a 63 year old male who presents with a follow-up for shingles.  Weeks before visiting the emergency room, he experienced an intermittent funny feeling in his left ear. On the morning after Labor Day, he noticed pressure in the ear and a small dot on his lip. By Thursday night, another dot appeared on the lower lip, and he applied peroxide, but it did not resolve. Overnight, he experienced swelling in the left jaw and lip, accompanied by numbness and pressure from the ear to the jaw, and palpable lymph nodes.  Initially suspecting an allergic reaction, he took Benadryl , which did not alleviate the symptoms. He then noticed small bumps on his back and under his lip, prompting him to visit the emergency room. Blood work was performed. He reports that he completed a seven-day course of Valtrex , taking  1000 mg three times daily as prescribed by the emergency room. After finishing the medication, he experienced severe itching, pressure in the ear, and numbness in the lip and tongue.  He reports that taking an additional dose of Valtrex  500 mg helped him sleep. He has taken three doses in total and feels better today, although he still experiences itching and burning in certain areas. His bottom lip swells at night, and he describes a 'crazy' sensation.  He has a history of shingles in his family, with his sister having had a severe case when he was young.  He is currently taking aspirin 81 mg, fish oil, Flonase  as needed, and has recently run out of turmeric supplements. He has started taking a glucosamine supplement and a multivitamin. He also mentions having magnesium  glycinate but has not started it yet. He continues to take metoprolol  and uses Systane eye drops as needed.  He reports losing weight since the onset of his symptoms, dropping from 169 lbs to 164.6 lbs. He attributes this to not eating enough and drinking a lot of water. No issues with swallowing. Numbness and pressure in the left ear and jaw, swelling in the left jaw and lip, and palpable lymph nodes. Itching and burning in certain areas and numbness in the lip and tongue.    Past Medical History:  Diagnosis Date   Allergy    Anxiety    Arthritis    OA AND PAIN RT KNEE   Heart murmur    History of  colonoscopy    Hypertension    Sinus problem    Past Surgical History:  Procedure Laterality Date   FRACTURE SURGERY  1982   RT KNEE   JOINT REPLACEMENT     KELOID SURGERY     BACK OF NECK  1986 & 1988   KNEE ARTHROSCOPY Right 11/25/2014   Procedure: RIGHT ARTHROSCOPY KNEE/SYNOVECTOMY;  Surgeon: Dempsey Melodi GAILS, MD;  Location: [facility];  Service: Orthopedics;  Laterality: Right;   TOTAL KNEE ARTHROPLASTY Right 03/24/2014   Procedure: RIGHT TOTAL KNEE ARTHROPLASTY WITH HARDWARE REMOVAL;  Surgeon: Dempsey GAILS Melodi, MD;  Location:  [facility];  Service: Orthopedics;  Laterality: Right;    Allergies  Allergen Reactions   Hydrochlorothiazide      Blurred vision vertigo    Lisinopril  Swelling    LIPS    Nifedipine  Other (See Comments)    Gingival hyperplasia.     Outpatient Encounter Medications as of 09/05/2024  Medication Sig   APPLE CIDER VINEGAR PO Take by mouth daily. 1.5 tablespoon.   aspirin 81 MG tablet Take 81 mg by mouth daily after lunch.   Fish Oil-Cholecalciferol (FISH OIL + D3 PO) Take 1 capsule by mouth daily after lunch.    fluticasone  (FLONASE ) 50 MCG/ACT nasal spray Place 2 sprays into both nostrils daily. (Patient taking differently: Place 2 sprays into both nostrils as needed.)   Glucosamine HCl 1000 MG TABS Take 1 tablet by mouth daily.   Magnesium  Glycinate 100 MG CAPS Take 1 capsule by mouth daily.   Propylene Glycol (SYSTANE BALANCE) 0.6 % SOLN 1-3 drops in each eye daily as needed for lubrication (Patient taking differently: as needed. 1-3 drops in each eye daily as needed for lubrication)   valACYclovir  (VALTREX ) 1000 MG tablet Take 1 tablet (1,000 mg total) by mouth 3 (three) times daily for 7 days.   [DISCONTINUED] metoprolol  succinate (TOPROL -XL) 25 MG 24 hr tablet Take 0.5 tablets (12.5 mg total) by mouth daily. TAKE 1/2 TABLET(12.5 MG) BY MOUTH DAILY   metoprolol  succinate (TOPROL -XL) 25 MG 24 hr tablet Take 0.5 tablets (12.5 mg total) by mouth daily. TAKE 1/2 TABLET(12.5 MG) BY MOUTH DAILY   [DISCONTINUED] Turmeric (QC TUMERIC COMPLEX PO) Take 700 mg by mouth daily. (Patient not taking: Reported on 09/05/2024)   No facility-administered encounter medications on file as of 09/05/2024.    Review of Systems  Constitutional:  Negative for appetite change, chills, fatigue, fever and unexpected weight change.  HENT:  Negative for congestion, dental problem, ear discharge, ear pain, hearing loss, nosebleeds, postnasal drip, rhinorrhea, sinus pressure, sinus pain, sneezing, sore throat, tinnitus and  trouble swallowing.   Eyes:  Negative for pain, discharge, redness, itching and visual disturbance.  Respiratory:  Negative for cough, chest tightness, shortness of breath and wheezing.   Cardiovascular:  Negative for chest pain, palpitations and leg swelling.  Gastrointestinal:  Negative for abdominal distention, abdominal pain, blood in stool, constipation, diarrhea, nausea and vomiting.  Genitourinary:  Negative for difficulty urinating, dysuria, flank pain, frequency and urgency.  Musculoskeletal:  Negative for arthralgias, back pain, gait problem, joint swelling and myalgias.  Skin:  Positive for rash. Negative for color change, pallor and wound.       Left face /chin and neck painful rash   Neurological:  Negative for dizziness, syncope, speech difficulty, weakness, light-headedness, numbness and headaches.    Immunization History  Administered Date(s) Administered   PFIZER(Purple Top)SARS-COV-2 Vaccination 03/07/2020, 03/28/2020, 11/28/2020   Tdap 02/25/2013   Pertinent  Health Maintenance  Due  Topic Date Due   Colonoscopy  12/20/2019   Influenza Vaccine  12/05/2024 (Originally 07/19/2024)      11/07/2022   11:15 AM 01/03/2023    1:05 PM 11/07/2023    1:54 PM 05/08/2024    1:42 PM 09/05/2024    2:19 PM  Fall Risk  Falls in the past year? 0 0 0 0 0  Was there an injury with Fall? 0 0 0 0 0  Fall Risk Category Calculator 0 0 0 0 0  Fall Risk Category (Retired) Low       Patient at Risk for Falls Due to  No Fall Risks No Fall Risks No Fall Risks No Fall Risks  Fall risk Follow up  Falls evaluation completed   Falls evaluation completed Falls evaluation completed     Data saved with a previous flowsheet row definition   Functional Status Survey:    Vitals:   09/05/24 1422  BP: 128/82  Pulse: 84  Resp: 19  Temp: 97.8 F (36.6 C)  SpO2: 97%  Weight: 164 lb 9.6 oz (74.7 kg)  Height: 5' 11 (1.803 m)   Body mass index is 22.96 kg/m. Physical Exam   VITALS: T- 97.8,  P- 84, BP- 128/82, SaO2- 99% MEASUREMENTS: Weight- 164.6. GENERAL: Alert, cooperative, well developed, no acute distress. HEENT: Normocephalic, normal oropharynx, moist mucous membranes, tympanic membranes normal bilaterally, nasal mucosa without rash, throat and inner cheek normal. NECK: No cervical lymphadenopathy. CHEST: Clear to auscultation bilaterally, no wheezes, rhonchi, or crackles. CARDIOVASCULAR: Normal heart rate and rhythm, S1 and S2 normal without murmurs. ABDOMEN: Soft, non-tender, non-distended, without organomegaly, normal bowel sounds. EXTREMITIES: No cyanosis or edema. NEUROLOGICAL: Cranial nerves grossly intact, moves all extremities without gross motor or sensory deficit. SKIN: vesicular rash present on the neck and beard area. No drainage noted   Labs reviewed: Recent Labs    11/01/23 1429 05/01/24 1333 08/23/24 1852  NA 140 136 136  K 4.3 4.3 5.0  CL 103 102 100  CO2 28 25 22   GLUCOSE 112* 98 99  BUN 14 16 10   CREATININE 1.08 0.91 1.14  CALCIUM 9.3 9.4 9.6   Recent Labs    11/01/23 1429 05/01/24 1333 08/23/24 1852  AST 20 18 35  ALT 19 15 14   ALKPHOS  --   --  55  BILITOT 0.6 0.4 0.7  PROT 7.0 7.2 7.7  ALBUMIN  --   --  4.3   Recent Labs    11/01/23 1429 05/01/24 1333 08/23/24 1852  WBC 4.6 6.3 8.4  NEUTROABS 3,455 4,983 7.0  HGB 15.3 14.4 14.2  HCT 45.0 43.4 41.4  MCV 91.6 94.3 90.2  PLT 289 291 283   Lab Results  Component Value Date   TSH 1.13 05/01/2024   Lab Results  Component Value Date   HGBA1C 5.6 05/01/2024   Lab Results  Component Value Date   CHOL 166 05/01/2024   HDL 64 05/01/2024   LDLCALC 88 05/01/2024   TRIG 49 05/01/2024   CHOLHDL 2.6 05/01/2024    Significant Diagnostic Results in last 30 days:  No results found.  Assessment/Plan  Herpes zoster (shingles), left facial and cervical distribution Herpes zoster affecting the left facial and cervical region, initially presenting with a rash and pressure  sensation in the left ear, followed by numbness and tingling in the lip and tongue. Symptoms persisted despite initial treatment with Valtrex  1000 mg three times daily for seven days. Residual symptoms include itching, numbness, and  pressure in the left ear and lip. Examination reveals bumps and redness in the beard area and neck, consistent with shingles. Blood work from the ER showed normal electrolytes, kidney function, liver function, and CBC, with a slightly low lymphocyte count, likely due to the infection. Discussed that shingles can follow nerve pathways and may cause residual numbness or tingling. Shingles vaccination can reduce severity of future episodes. - Prescribe another 7-day course of Valtrex  1000 mg three times daily. - Advise using a cool compress to alleviate itching and avoid scratching. - Discuss potential need for gabapentin if symptoms persist after completing antiviral treatment. - Consider shingles vaccination after recovery to reduce severity of future episodes.  Essential hypertension Blood pressure is well-controlled at 128/82 mmHg. Current medications include metoprolol , aspirin, and fish oil. - Refill metoprolol  prescription.   Family/ staff Communication: Reviewed plan of care with patient verbalized understanding   Labs/tests ordered: None   Next Appointment: Return if symptoms worsen or fail to improve.   Total time: 30 minutes. Greater than 50% of total time spent doing patient education regarding Herpes Zoster,HTN,health maintenance including symptom/medication management.   Roxan JAYSON Plough, NP

## 2024-09-16 ENCOUNTER — Telehealth: Payer: Self-pay | Admitting: Family

## 2024-09-16 NOTE — Telephone Encounter (Signed)
 Valacyclovir  was prescribed only for acute infection.If still having any symptoms recommend office visit appointment to evaluate.

## 2024-09-16 NOTE — Telephone Encounter (Signed)
 Mychart message sent to patient.

## 2024-09-16 NOTE — Telephone Encounter (Signed)
 RX was prescribed x 7 days at last visit, please advise

## 2024-09-16 NOTE — Telephone Encounter (Signed)
 Copied from CRM #[id number]. Topic: Clinical - Medication Refill >> Sep 16, 2024  2:41 PM Miquel SAILOR wrote: Medication: valACYclovir  (VALTREX ) 1000 MG tablet [[id number]]  ENDED  Has the patient contacted their pharmacy? Yes (Agent: If no, request that the patient contact the pharmacy for the refill. If patient does not wish to contact the pharmacy document the reason why and proceed with request.) (Agent: If yes, when and what did the pharmacy advise?)  This is the patient's preferred pharmacy:  [id number] - [place], [place] - [address] AT [facility] [address] [place] [address] Phone: [phone] Fax: [phone]  Is this the correct pharmacy for this prescription? Yes If no, delete pharmacy and type the correct one.   Has the prescription been filled recently? Yes  Is the patient out of the medication? Yes  Has the patient been seen for an appointment in the last year OR does the patient have an upcoming appointment? Yes  Can we respond through MyChart? Yes  Agent: Please be advised that Rx refills may take up to 3 business days. We ask that you follow-up with your pharmacy.

## 2024-09-18 NOTE — Telephone Encounter (Signed)
 MyChart message sent to patient with provider reply.

## 2024-09-18 NOTE — Telephone Encounter (Signed)
 Itching and burning sensation takes a 4-6 weeks to resolved.

## 2024-09-19 MED ORDER — CAPSAICIN 0.025 % EX CREA: TOPICAL_CREAM | Freq: Two times a day (BID) | CUTANEOUS | 0 refills | Status: DC

## 2024-09-19 NOTE — Addendum Note (Signed)
 Addended by: SUELLEN DEVIN BROCKS on: 09/19/2024 10:44 AM   Modules accepted: Orders

## 2024-09-19 NOTE — Telephone Encounter (Signed)
 Use Zostrix 0.025 % topical cream one application twice daily as needed for pain.

## 2024-09-23 NOTE — Telephone Encounter (Signed)
 Please schedule an office appointment to evaluate breakout.

## 2024-09-24 ENCOUNTER — Encounter: Payer: Self-pay | Admitting: Family

## 2024-09-24 ENCOUNTER — Ambulatory Visit: Admitting: Family

## 2024-09-24 VITALS — BP 130/80 | HR 78 | Temp 98.0°F | Resp 20 | Ht 71.0 in | Wt 166.8 lb

## 2024-09-24 DIAGNOSIS — B028 Zoster with other complications: Secondary | ICD-10-CM

## 2024-09-24 MED ORDER — VALACYCLOVIR HCL 1 G PO TABS: 1000.0000 mg | ORAL_TABLET | Freq: Two times a day (BID) | ORAL | 0 refills | Status: AC

## 2024-09-24 NOTE — Progress Notes (Signed)
 Provider: Roxan Plough FNP-C  Henli Hey, Roxan BROCKS, NP  Patient Care Team: Amberlie Gaillard, Roxan BROCKS, NP as PCP - General (Family Medicine)  Extended Emergency Contact Information Primary Emergency Contact: Billy Morna FALCON. Address: [address]          [place], [place] Home Phone: [phone] Mobile Phone: [phone] Relation: Father Secondary Emergency Contact: [facility] Address: [address]          [place], [place] [address] [place] Home Phone: [phone] Relation: Friend Interpreter needed? No  Code Status:  Full Code  Goals of care: Advanced Directive information    09/05/2024    2:19 PM  Advanced Directives  Does Patient Have a Medical Advance Directive? No  Would patient like information on creating a medical advance directive? No - Patient declined     Chief Complaint  Patient presents with   Follow-up    Follow up /Shingles.    Discussed the use of AI scribe software for clinical note transcription with the patient, who gave verbal consent to proceed.  History of Present Illness   Damon Butler is a 63 year old male who presents with persistent shingles symptoms.  He has experienced a recurrence of shingles symptoms, initially treated with two rounds of Valacyclovir  since August 23, 2024, following an emergency room visit. Symptoms include numbness and tingling in the lip and jaw, burning sensations, and fluid-filled lesions primarily in the beard and scalp areas. The rash is no longer visible, but itching and burning persist, especially at night.  The lesions were initially thick and clustered in the beard area, with some spreading to the scalp, which were not visible due to hair coverage. After cutting his hair, he noticed small spots on the skin. He attempted to use capsaicin cream but noted that the instructions advised against use on irritated or red skin.  He has been taking a generic form  of Valacyclovir , which seemed to improve the condition, but new fluid-filled lesions appeared, particularly on the face and scalp. Symptoms have persisted for about a month, with some improvement allowing him to lay on the affected side for the first time in weeks.  No fever or chills recently. He has been drinking a lot of water to support kidney function. He recalls a previous episode of shingles last year in a similar area, which resolved but has since recurred.   Past Medical History:  Diagnosis Date   Allergy    Anxiety    Arthritis    OA AND PAIN RT KNEE   Heart murmur    History of colonoscopy    Hypertension    Shingles    Sinus problem    Past Surgical History:  Procedure Laterality Date   FRACTURE SURGERY  1982   RT KNEE   JOINT REPLACEMENT     KELOID SURGERY     BACK OF NECK  1986 & 1988   KNEE ARTHROSCOPY Right 11/25/2014   Procedure: RIGHT ARTHROSCOPY KNEE/SYNOVECTOMY;  Surgeon: Dempsey Melodi GAILS, MD;  Location: [facility];  Service: Orthopedics;  Laterality: Right;   TOTAL KNEE ARTHROPLASTY Right 03/24/2014   Procedure: RIGHT TOTAL KNEE ARTHROPLASTY WITH HARDWARE REMOVAL;  Surgeon: Dempsey GAILS Melodi, MD;  Location: [facility];  Service: Orthopedics;  Laterality: Right;    Allergies  Allergen Reactions   Hydrochlorothiazide      Blurred vision vertigo    Lisinopril  Swelling    LIPS    Nifedipine  Other (See Comments)  Gingival hyperplasia.     Outpatient Encounter Medications as of 09/24/2024  Medication Sig   aspirin 81 MG tablet Take 81 mg by mouth daily after lunch.   Fish Oil-Cholecalciferol (FISH OIL + D3 PO) Take 1 capsule by mouth daily after lunch.    fluticasone  (FLONASE ) 50 MCG/ACT nasal spray Place 2 sprays into both nostrils daily. (Patient taking differently: Place 2 sprays into both nostrils as needed.)   Glucosamine HCl 1000 MG TABS Take 1 tablet by mouth daily.   metoprolol  succinate (TOPROL -XL) 25 MG 24 hr tablet Take 0.5 tablets (12.5 mg total) by mouth  daily. TAKE 1/2 TABLET(12.5 MG) BY MOUTH DAILY   Propylene Glycol (SYSTANE BALANCE) 0.6 % SOLN 1-3 drops in each eye daily as needed for lubrication (Patient taking differently: as needed. 1-3 drops in each eye daily as needed for lubrication)   valACYclovir  (VALTREX ) 1000 MG tablet Take 1 tablet (1,000 mg total) by mouth 2 (two) times daily for 10 days.   APPLE CIDER VINEGAR PO Take by mouth daily. 1.5 tablespoon. (Patient not taking: Reported on 09/24/2024)   capsaicin (ZOSTRIX) 0.025 % cream Apply topically 2 (two) times daily. (Patient not taking: Reported on 09/24/2024)   Magnesium  Glycinate 100 MG CAPS Take 1 capsule by mouth daily. (Patient not taking: Reported on 09/24/2024)   No facility-administered encounter medications on file as of 09/24/2024.    Review of Systems  Constitutional:  Negative for appetite change, chills, fatigue, fever and unexpected weight change.  HENT:  Negative for congestion, ear discharge, ear pain, hearing loss, rhinorrhea, sinus pressure, sinus pain, sneezing and sore throat.   Eyes:  Negative for pain, discharge, redness, itching and visual disturbance.  Respiratory:  Negative for cough, chest tightness, shortness of breath and wheezing.   Cardiovascular:  Negative for chest pain, palpitations and leg swelling.  Skin:  Positive for rash. Negative for color change, pallor and wound.  Neurological:  Negative for dizziness, weakness, light-headedness, numbness and headaches.    Immunization History  Administered Date(s) Administered   PFIZER(Purple Top)SARS-COV-2 Vaccination 03/07/2020, 03/28/2020, 11/28/2020   Tdap 02/25/2013   Pertinent  Health Maintenance Due  Topic Date Due   Colonoscopy  12/20/2019   Influenza Vaccine  12/05/2024 (Originally 07/19/2024)      11/07/2022   11:15 AM 01/03/2023    1:05 PM 11/07/2023    1:54 PM 05/08/2024    1:42 PM 09/05/2024    2:19 PM  Fall Risk  Falls in the past year? 0 0 0 0 0  Was there an injury with Fall? 0 0 0  0 0  Fall Risk Category Calculator 0 0 0 0 0  Fall Risk Category (Retired) Low       Patient at Risk for Falls Due to  No Fall Risks No Fall Risks No Fall Risks No Fall Risks  Fall risk Follow up  Falls evaluation completed   Falls evaluation completed Falls evaluation completed     Data saved with a previous flowsheet row definition   Functional Status Survey:    Vitals:   09/24/24 1513  BP: 130/80  Pulse: 78  Resp: 20  Temp: 98 F (36.7 C)  SpO2: 97%  Weight: 166 lb 12.8 oz (75.7 kg)  Height: 5' 11 (1.803 m)   Body mass index is 23.26 kg/m. Physical Exam  GENERAL: Alert, cooperative, well developed, no acute distress. HEENT: Normocephalic, normal oropharynx, moist mucous membranes. CHEST: Clear to auscultation bilaterally, no wheezes, rhonchi, or crackles. CARDIOVASCULAR: Normal heart  rate and rhythm, S1 and S2 normal without murmurs. ABDOMEN: Soft, non-tender, non-distended, without organomegaly, normal bowel sounds. SKIN:raised red rash on left temporal and chin area  PSYCHIATRY/BEHAVIORAL: Mood stable   Labs reviewed: Recent Labs    11/01/23 1429 05/01/24 1333 08/23/24 1852  NA 140 136 136  K 4.3 4.3 5.0  CL 103 102 100  CO2 28 25 22   GLUCOSE 112* 98 99  BUN 14 16 10   CREATININE 1.08 0.91 1.14  CALCIUM 9.3 9.4 9.6   Recent Labs    11/01/23 1429 05/01/24 1333 08/23/24 1852  AST 20 18 35  ALT 19 15 14   ALKPHOS  --   --  55  BILITOT 0.6 0.4 0.7  PROT 7.0 7.2 7.7  ALBUMIN  --   --  4.3   Recent Labs    11/01/23 1429 05/01/24 1333 08/23/24 1852  WBC 4.6 6.3 8.4  NEUTROABS 3,455 4,983 7.0  HGB 15.3 14.4 14.2  HCT 45.0 43.4 41.4  MCV 91.6 94.3 90.2  PLT 289 291 283   Lab Results  Component Value Date   TSH 1.13 05/01/2024   Lab Results  Component Value Date   HGBA1C 5.6 05/01/2024   Lab Results  Component Value Date   CHOL 166 05/01/2024   HDL 64 05/01/2024   LDLCALC 88 05/01/2024   TRIG 49 05/01/2024   CHOLHDL 2.6 05/01/2024     Significant Diagnostic Results in last 30 days:  No results found.  Assessment/Plan  Shingles (herpes zoster) involving face and scalp Recurrent shingles involving the face and scalp, initially treated with two rounds of Valacyclovir . Symptoms include burning, numbness, and tingling, particularly at night. Current presentation includes scattered, larger lesions rather than typical clusters, raising concern for other conditions. 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 No fever or chills reported. Risk of ocular involvement discussed, but currently no eye involvement. Symptoms have persisted since September 5th, with some improvement noted after taking generic medication. - Prescribe Valacyclovir  1000 mg, one tablet in the morning and one in the evening for 10 days. - Advise to report if symptoms persist after completing the medication. - Consider referral to a dermatologist if symptoms do not resolve after the current treatment course.    Family/ staff Communication: Reviewed plan of care with patient verbalized understanding   Labs/tests ordered: None   Next Appointment: Return if symptoms worsen or fail to improve.   Total time: 20 minutes. Greater than 50% of total time spent doing patient education regarding Rash and health maintenance including symptom/medication management.   Roxan JAYSON Plough, NP

## 2024-10-30 ENCOUNTER — Other Ambulatory Visit: Payer: Self-pay

## 2024-10-30 ENCOUNTER — Other Ambulatory Visit

## 2024-10-30 DIAGNOSIS — I10 Essential (primary) hypertension: Secondary | ICD-10-CM

## 2024-10-30 DIAGNOSIS — R7303 Prediabetes: Secondary | ICD-10-CM

## 2024-10-30 DIAGNOSIS — E7800 Pure hypercholesterolemia, unspecified: Secondary | ICD-10-CM

## 2024-10-31 LAB — COMPLETE METABOLIC PANEL WITHOUT GFR
AG Ratio: 1.7 (calc) (ref 1.0–2.5)
ALT: 13 U/L (ref 9–46)
AST: 16 U/L (ref 10–35)
Albumin: 4.3 g/dL (ref 3.6–5.1)
Alkaline phosphatase (APISO): 50 U/L (ref 35–144)
BUN: 16 mg/dL (ref 7–25)
CO2: 29 mmol/L (ref 20–32)
Calcium: 9.5 mg/dL (ref 8.6–10.3)
Chloride: 102 mmol/L (ref 98–110)
Creat: 0.92 mg/dL (ref 0.70–1.35)
Globulin: 2.5 g/dL (ref 1.9–3.7)
Glucose, Bld: 107 mg/dL — ABNORMAL HIGH (ref 65–99)
Potassium: 4.4 mmol/L (ref 3.5–5.3)
Sodium: 138 mmol/L (ref 135–146)
Total Bilirubin: 0.6 mg/dL (ref 0.2–1.2)
Total Protein: 6.8 g/dL (ref 6.1–8.1)

## 2024-10-31 LAB — LIPID PANEL
Cholesterol: 159 mg/dL (ref <200)
HDL: 58 mg/dL (ref >=40)
LDL Cholesterol (Calc): 87 mg/dL
Non-HDL Cholesterol (Calc): 101 mg/dL (ref <130)
Total CHOL/HDL Ratio: 2.7 (calc) (ref <5.0)
Triglycerides: 47 mg/dL (ref <150)

## 2024-10-31 LAB — CBC WITH DIFFERENTIAL/PLATELET
Absolute Lymphocytes: 887 {cells}/uL (ref 850–3900)
Absolute Monocytes: 464 {cells}/uL (ref 200–950)
Basophils Absolute: 17 {cells}/uL (ref 0–200)
Basophils Relative: 0.3 %
Eosinophils Absolute: 41 {cells}/uL (ref 15–500)
Eosinophils Relative: 0.7 %
HCT: 41.5 % (ref 38.5–50.0)
Hemoglobin: 14 g/dL (ref 13.2–17.1)
MCH: 31.5 pg (ref 27.0–33.0)
MCHC: 33.7 g/dL (ref 32.0–36.0)
MCV: 93.5 fL (ref 80.0–100.0)
MPV: 9.9 fL (ref 7.5–12.5)
Monocytes Relative: 8 %
Neutro Abs: 4391 {cells}/uL (ref 1500–7800)
Neutrophils Relative %: 75.7 %
Platelets: 318 Thousand/uL (ref 140–400)
RBC: 4.44 Million/uL (ref 4.20–5.80)
RDW: 13.2 % (ref 11.0–15.0)
Total Lymphocyte: 15.3 %
WBC: 5.8 Thousand/uL (ref 3.8–10.8)

## 2024-10-31 LAB — HEMOGLOBIN A1C
Hgb A1c MFr Bld: 5.7 % — ABNORMAL HIGH (ref <5.7)
Mean Plasma Glucose: 117 mg/dL
eAG (mmol/L): 6.5 mmol/L

## 2024-10-31 LAB — TSH: TSH: 1.17 m[IU]/L (ref 0.40–4.50)

## 2024-11-06 ENCOUNTER — Ambulatory Visit: Admitting: Family

## 2024-11-06 ENCOUNTER — Ambulatory Visit: Payer: Self-pay | Admitting: Family

## 2024-11-06 ENCOUNTER — Encounter: Payer: Self-pay | Admitting: Family

## 2024-11-06 VITALS — BP 120/74 | HR 80 | Temp 98.6°F | Ht 71.0 in | Wt 168.4 lb

## 2024-11-06 DIAGNOSIS — E7800 Pure hypercholesterolemia, unspecified: Secondary | ICD-10-CM | POA: Diagnosis not present

## 2024-11-06 DIAGNOSIS — R7303 Prediabetes: Secondary | ICD-10-CM

## 2024-11-06 DIAGNOSIS — R21 Rash and other nonspecific skin eruption: Secondary | ICD-10-CM

## 2024-11-06 DIAGNOSIS — I10 Essential (primary) hypertension: Secondary | ICD-10-CM

## 2024-11-06 DIAGNOSIS — J309 Allergic rhinitis, unspecified: Secondary | ICD-10-CM

## 2024-11-06 MED ORDER — DOXYCYCLINE HYCLATE 100 MG PO TABS: 100.0000 mg | ORAL_TABLET | Freq: Two times a day (BID) | ORAL | 0 refills | Status: AC

## 2024-11-12 NOTE — Progress Notes (Signed)
 Provider: Roxan Plough FNP-C   Ova Gillentine, Damon BROCKS, NP  Patient Care Team: Bryella Diviney, Damon BROCKS, NP as PCP - General (Family Medicine)  Extended Emergency Contact Information Primary Emergency Contact: Billy Morna FALCON. Address: [address]          [place], [place] Home Phone: [phone] Mobile Phone: [phone] Relation: Father Secondary Emergency Contact: [facility] Address: [address]          [place], [place] [address] [place] Home Phone: [phone] Relation: Friend Interpreter needed? No  Code Status:  Full Code  Goals of care: Advanced Directive information    09/05/2024    2:19 PM  Advanced Directives  Does Patient Have a Medical Advance Directive? No  Would patient like information on creating a medical advance directive? No - Patient declined     Chief Complaint  Patient presents with   Follow-up    Patient would like to discuss case shingles that he had last of August.    Discussed the use of AI scribe software for clinical note transcription with the patient, who gave verbal consent to proceed.  History of Present Illness   Damon Butler is a 63 year old male who presents with a rash on the neck and chin.  He developed a rash on the right side of his neck and chin, which began as smaller spots last Friday and Saturday and have since grown to pea-sized lesions. He suspects mosquito bites from working outside last Wednesday and Thursday, but notes that the lesions are larger than typical mosquito bites. The rash is described as burning and itching, and he has been using Aquaphor and Sarna cream for relief.  He has a history of shingles, previously affecting the left side of his chin, and is concerned about a recurrence. He recalls experiencing subtle symptoms a month to a month and a half before his previous shingles diagnosis, including ear pain and a sensation of liquid in the ear.  No  fever, chills, fatigue, or changes in his medications. His current medications include aspirin 1 mg, fish oil, Flonase  as needed, glucosamine 1000 mg daily, metoprolol  12.5 mg daily, and Systane eye drops.  He has a history of prediabetes with a recent glucose level of 107 and an A1c of 5.7. He reports increased consumption of potato chips, cake, and beer, and decreased physical activity due to resting from shingles.  He has a history of hypertension, currently managed with metoprolol , and is monitoring his blood pressure at home. He reports a history of palpitations long ago but none recently.   Past Medical History:  Diagnosis Date   Allergy    Anxiety    Arthritis    OA AND PAIN RT KNEE   Heart murmur    History of colonoscopy    Hypertension    Shingles    Sinus problem    Past Surgical History:  Procedure Laterality Date   FRACTURE SURGERY  1982   RT KNEE   JOINT REPLACEMENT     KELOID SURGERY     BACK OF NECK  1986 & 1988   KNEE ARTHROSCOPY Right 11/25/2014   Procedure: RIGHT ARTHROSCOPY KNEE/SYNOVECTOMY;  Surgeon: Dempsey Melodi GAILS, MD;  Location: [facility];  Service: Orthopedics;  Laterality: Right;   TOTAL KNEE ARTHROPLASTY Right 03/24/2014   Procedure: RIGHT TOTAL KNEE ARTHROPLASTY WITH HARDWARE REMOVAL;  Surgeon: Dempsey GAILS Melodi, MD;  Location: [facility];  Service: Orthopedics;  Laterality: Right;  Allergies  Allergen Reactions   Hydrochlorothiazide      Blurred vision vertigo    Lisinopril  Swelling    LIPS    Nifedipine  Other (See Comments)    Gingival hyperplasia.     Allergies as of 11/06/2024       Reactions   Hydrochlorothiazide     Blurred vision vertigo    Lisinopril  Swelling   LIPS    Nifedipine  Other (See Comments)   Gingival hyperplasia.         Medication List        Accurate as of November 06, 2024 11:59 PM. If you have any questions, ask your nurse or doctor.          aspirin 81 MG tablet Take 81 mg by mouth daily after lunch.    doxycycline  100 MG tablet Commonly known as: VIBRA -TABS Take 1 tablet (100 mg total) by mouth 2 (two) times daily for 7 days. Started by: Dynasty Holquin C Hansel Devan   FISH OIL + D3 PO Take 1 capsule by mouth daily after lunch.   fluticasone  50 MCG/ACT nasal spray Commonly known as: FLONASE  Place 2 sprays into both nostrils daily.   Glucosamine HCl 1000 MG Tabs Take 1 tablet by mouth daily.   metoprolol  succinate 25 MG 24 hr tablet Commonly known as: TOPROL -XL Take 0.5 tablets (12.5 mg total) by mouth daily. TAKE 1/2 TABLET(12.5 MG) BY MOUTH DAILY   Systane Balance 0.6 % Soln Generic drug: Propylene Glycol 1-3 drops in each eye daily as needed for lubrication        Review of Systems  Constitutional:  Negative for appetite change, chills, fatigue, fever and unexpected weight change.  HENT:  Negative for congestion, dental problem, ear discharge, ear pain, facial swelling, hearing loss, nosebleeds, postnasal drip, rhinorrhea, sinus pressure, sinus pain, sneezing, sore throat, tinnitus and trouble swallowing.   Eyes:  Negative for pain, discharge, redness, itching and visual disturbance.  Respiratory:  Negative for cough, chest tightness, shortness of breath and wheezing.   Cardiovascular:  Negative for chest pain, palpitations and leg swelling.  Gastrointestinal:  Negative for abdominal distention, abdominal pain, blood in stool, constipation, diarrhea, nausea and vomiting.  Endocrine: Negative for cold intolerance, heat intolerance, polydipsia, polyphagia and polyuria.  Genitourinary:  Negative for difficulty urinating, dysuria, flank pain, frequency and urgency.  Musculoskeletal:  Negative for arthralgias, back pain, gait problem, joint swelling, myalgias, neck pain and neck stiffness.  Skin:  Negative for color change, pallor, rash and wound.  Neurological:  Negative for dizziness, syncope, speech difficulty, weakness, light-headedness, numbness and headaches.  Hematological:  Does not  bruise/bleed easily.  Psychiatric/Behavioral:  Negative for agitation, behavioral problems, confusion, hallucinations, self-injury, sleep disturbance and suicidal ideas. The patient is not nervous/anxious.     Immunization History  Administered Date(s) Administered   PFIZER(Purple Top)SARS-COV-2 Vaccination 03/07/2020, 03/28/2020, 11/28/2020   Tdap 02/25/2013   Pertinent  Health Maintenance Due  Topic Date Due   Colonoscopy  12/20/2019   Influenza Vaccine  12/05/2024 (Originally 07/19/2024)      01/03/2023    1:05 PM 11/07/2023    1:54 PM 05/08/2024    1:42 PM 09/05/2024    2:19 PM 11/06/2024    1:44 PM  Fall Risk  Falls in the past year? 0 0 0 0 0  Was there an injury with Fall? 0 0 0 0 0  Fall Risk Category Calculator 0 0 0 0 0  Patient at Risk for Falls Due to No Fall Risks No Fall Risks No  Fall Risks No Fall Risks No Fall Risks  Fall risk Follow up Falls evaluation completed   Falls evaluation completed Falls evaluation completed Falls evaluation completed     Data saved with a previous flowsheet row definition   Functional Status Survey:    Vitals:   11/06/24 1338  BP: 120/74  Pulse: 80  Temp: 98.6 F (37 C)  SpO2: 97%  Weight: 168 lb 6.4 oz (76.4 kg)  Height: 5' 11 (1.803 m)   Body mass index is 23.49 kg/m. Physical Exam VITALS: BP- 120/80 GENERAL: Alert, cooperative, well developed, no acute distress HEENT: Normocephalic, normal oropharynx, moist mucous membranes, ears and nose normal CHEST: Clear to auscultation bilaterally, no wheezes, rhonchi, or crackles CARDIOVASCULAR: Normal heart rate and rhythm, S1 and S2 normal without murmurs ABDOMEN: Soft, non-tender, non-distended, without organomegaly, normal bowel sounds EXTREMITIES: No cyanosis or edema, extremities normal NEUROLOGICAL: Cranial nerves grossly intact, moves all extremities without gross motor or sensory deficit SKIN: Skin on neck and chin red and appears infected   Labs reviewed: Recent Labs     05/01/24 1333 08/23/24 1852 10/30/24 1349  NA 136 136 138  K 4.3 5.0 4.4  CL 102 100 102  CO2 25 22 29   GLUCOSE 98 99 107*  BUN 16 10 16   CREATININE 0.91 1.14 0.92  CALCIUM 9.4 9.6 9.5   Recent Labs    05/01/24 1333 08/23/24 1852 10/30/24 1349  AST 18 35 16  ALT 15 14 13   ALKPHOS  --  55  --   BILITOT 0.4 0.7 0.6  PROT 7.2 7.7 6.8  ALBUMIN  --  4.3  --    Recent Labs    05/01/24 1333 08/23/24 1852 10/30/24 1349  WBC 6.3 8.4 5.8  NEUTROABS 4,983 7.0 4,391  HGB 14.4 14.2 14.0  HCT 43.4 41.4 41.5  MCV 94.3 90.2 93.5  PLT 291 283 318   Lab Results  Component Value Date   TSH 1.17 10/30/2024   Lab Results  Component Value Date   HGBA1C 5.7 (H) 10/30/2024   Lab Results  Component Value Date   CHOL 159 10/30/2024   HDL 58 10/30/2024   LDLCALC 87 10/30/2024   TRIG 47 10/30/2024   CHOLHDL 2.7 10/30/2024    Significant Diagnostic Results in last 30 days:  No results found.  Assessment/Plan  Rash with secondary bacterial infection Rash on the right side of the neck extending to the chin, characterized by redness and soreness, likely secondary to mosquito bites. No fever, chills, or fatigue. Differential includes shingles, but current presentation is more consistent with a bacterial infection due to redness and soreness. - Prescribed doxycycline  100 mg, one tablet in the morning and one in the evening for 7 days. - Advised to take a picture of the rash for monitoring. - If rash persists, will consider referral to a dermatologist.  Essential hypertension Blood pressure is well-controlled at 120/80 mmHg on metoprolol  12.5 mg daily. No recent palpitations. Discussed the importance of monitoring blood pressure at home to ensure stability. - Continue metoprolol  12.5 mg daily. - Monitor blood pressure at home for one month to assess stability. - If blood pressure remains stable, will consider discussing potential reduction in medication  dosage.  Prediabetes Glucose level is 107 mg/dL, indicating prediabetes. A1c has increased from 5.6% to 5.7%. Discussed dietary habits, including high intake of potato chips and beer, contributing to elevated glucose levels. - Advised dietary modifications to reduce intake of high-sugar and high-calorie foods. - Encouraged  regular exercise to help manage glucose levels. - Ordered lab work to recheck A1c in six months.  Pure hypercholesterolemia Cholesterol levels are well-controlled with total cholesterol at 159 mg/dL, HDL at 47 mg/dL, and LDL at 87 mg/dL. No immediate concerns with current lipid profile. - Continue current management and lifestyle modifications to maintain cholesterol levels.   Family/ staff Communication: Reviewed plan of care with patient verbalized understanding   Labs/tests ordered:  - CBC with Differential/Platelet - CMP with eGFR([facility]) - TSH - Hgb A1C - Lipid panel  Next Appointment : Return in about 6 months (around 05/06/2025) for medical mangement of chronic issues., Fasting labs in 6 months prior to visit.   Spent 30 minutes of Face to face and non-face to face with patient  >50% time spent counseling; reviewing medical record; tests; labs; documentation and developing future plan of care.   Damon JAYSON Plough, NP

## 2024-12-24 ENCOUNTER — Other Ambulatory Visit: Payer: Self-pay | Admitting: Family

## 2024-12-24 DIAGNOSIS — J309 Allergic rhinitis, unspecified: Secondary | ICD-10-CM

## 2025-04-30 ENCOUNTER — Other Ambulatory Visit

## 2025-05-07 ENCOUNTER — Ambulatory Visit: Admitting: Family
